# Patient Record
Sex: Female | Born: 1980 | Race: White | Hispanic: No | Marital: Married | Smoking: Never smoker
Health system: Southern US, Community
[De-identification: ages and names within clinical notes are randomized; demographics above are authoritative.]

## PROBLEM LIST (undated history)

## (undated) ENCOUNTER — Inpatient Hospital Stay (HOSPITAL_COMMUNITY): Payer: Self-pay

## (undated) DIAGNOSIS — R87629 Unspecified abnormal cytological findings in specimens from vagina: Secondary | ICD-10-CM

## (undated) DIAGNOSIS — E119 Type 2 diabetes mellitus without complications: Secondary | ICD-10-CM

## (undated) DIAGNOSIS — Z1589 Genetic susceptibility to other disease: Secondary | ICD-10-CM

## (undated) DIAGNOSIS — R51 Headache: Secondary | ICD-10-CM

## (undated) DIAGNOSIS — Z9889 Other specified postprocedural states: Secondary | ICD-10-CM

## (undated) DIAGNOSIS — J45909 Unspecified asthma, uncomplicated: Secondary | ICD-10-CM

## (undated) DIAGNOSIS — R519 Headache, unspecified: Secondary | ICD-10-CM

## (undated) DIAGNOSIS — R112 Nausea with vomiting, unspecified: Secondary | ICD-10-CM

## (undated) DIAGNOSIS — E7212 Methylenetetrahydrofolate reductase deficiency: Secondary | ICD-10-CM

## (undated) HISTORY — PX: CRYOTHERAPY: SHX1416

## (undated) HISTORY — DX: Genetic susceptibility to other disease: Z15.89

## (undated) HISTORY — PX: OTHER SURGICAL HISTORY: SHX169

## (undated) HISTORY — DX: Unspecified abnormal cytological findings in specimens from vagina: R87.629

## (undated) HISTORY — DX: Methylenetetrahydrofolate reductase deficiency: E72.12

## (undated) HISTORY — DX: Type 2 diabetes mellitus without complications: E11.9

---
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
## 2007-09-08 HISTORY — PX: DILATION AND CURETTAGE OF UTERUS: SHX78

## 2008-05-07 ENCOUNTER — Emergency Department (HOSPITAL_BASED_OUTPATIENT_CLINIC_OR_DEPARTMENT_OTHER): Admission: EM | Admit: 2008-05-07 | Discharge: 2008-05-07 | Payer: Self-pay | Admitting: Emergency Medicine

## 2008-07-29 ENCOUNTER — Emergency Department (HOSPITAL_BASED_OUTPATIENT_CLINIC_OR_DEPARTMENT_OTHER): Admission: EM | Admit: 2008-07-29 | Discharge: 2008-07-29 | Payer: Self-pay | Admitting: Emergency Medicine

## 2011-07-10 LAB — POCT TOXICOLOGY PANEL

## 2011-11-09 HISTORY — PX: DILATION AND CURETTAGE OF UTERUS: SHX78

## 2015-05-23 LAB — OB RESULTS CONSOLE HIV ANTIBODY (ROUTINE TESTING): HIV: NONREACTIVE

## 2015-05-23 LAB — OB RESULTS CONSOLE PLATELET COUNT: Platelets: 291 10*3/uL

## 2015-05-23 LAB — OB RESULTS CONSOLE GC/CHLAMYDIA
Chlamydia: NEGATIVE
GC PROBE AMP, GENITAL: NEGATIVE

## 2015-05-23 LAB — OB RESULTS CONSOLE HGB/HCT, BLOOD
HCT: 40 %
Hemoglobin: 13.6 g/dL

## 2015-05-23 LAB — OB RESULTS CONSOLE ABO/RH: RH TYPE: POSITIVE

## 2015-05-23 LAB — OB RESULTS CONSOLE RUBELLA ANTIBODY, IGM: Rubella: IMMUNE

## 2015-05-23 LAB — OB RESULTS CONSOLE HEPATITIS B SURFACE ANTIGEN: HEP B S AG: NEGATIVE

## 2015-05-23 LAB — OB RESULTS CONSOLE ANTIBODY SCREEN: Antibody Screen: NEGATIVE

## 2015-05-23 LAB — OB RESULTS CONSOLE RPR: RPR: NONREACTIVE

## 2015-05-30 ENCOUNTER — Encounter: Payer: Self-pay | Admitting: Obstetrics & Gynecology

## 2015-05-30 ENCOUNTER — Ambulatory Visit (INDEPENDENT_AMBULATORY_CARE_PROVIDER_SITE_OTHER): Payer: 59 | Admitting: Obstetrics & Gynecology

## 2015-05-30 VITALS — BP 137/87 | HR 88 | Ht 65.5 in | Wt 247.0 lb

## 2015-05-30 DIAGNOSIS — E7212 Methylenetetrahydrofolate reductase deficiency: Secondary | ICD-10-CM | POA: Insufficient documentation

## 2015-05-30 DIAGNOSIS — O2620 Pregnancy care for patient with recurrent pregnancy loss, unspecified trimester: Secondary | ICD-10-CM | POA: Insufficient documentation

## 2015-05-30 DIAGNOSIS — O09529 Supervision of elderly multigravida, unspecified trimester: Secondary | ICD-10-CM

## 2015-05-30 DIAGNOSIS — O99280 Endocrine, nutritional and metabolic diseases complicating pregnancy, unspecified trimester: Secondary | ICD-10-CM

## 2015-05-30 DIAGNOSIS — O09899 Supervision of other high risk pregnancies, unspecified trimester: Secondary | ICD-10-CM | POA: Insufficient documentation

## 2015-05-30 DIAGNOSIS — E119 Type 2 diabetes mellitus without complications: Secondary | ICD-10-CM

## 2015-05-30 DIAGNOSIS — O24319 Unspecified pre-existing diabetes mellitus in pregnancy, unspecified trimester: Secondary | ICD-10-CM | POA: Insufficient documentation

## 2015-05-30 NOTE — Progress Notes (Signed)
  Subjective:    Margaret Cabrera is being seen today for her first obstetrical visit.  This is not a planned pregnancy. She is excited about this 'miracle' pregnancy. She is at [redacted]w[redacted]d gestation. Her obstetrical history is significant for recurrent pregnancy loss; class B GDM; AMA. Relationship with FOB: spouse, living together. Patient does intend to breast feed. Pregnancy history fully reviewed. Izell [place], MD- endrinologist.  Appt with nutritionist next week 8/28.  Appt Dr. Allena Katz 07/04/2015.    Past Medical History  Diagnosis Date  . Diabetes mellitus without complication   . Vaginal Pap smear, abnormal   . Preterm labor   . MTHFR mutation      Patient reports headache and nausea.  Review of Systems:   Review of Systems  Objective:     BP 137/87 mmHg  Pulse 88  Ht 5' 5.5" (1.664 m)  Wt 247 lb (112.038 kg)  BMI 40.46 kg/m2  LMP 02/06/2015 Physical Exam  Exam Exam deferred   Assessment:    Pregnancy: [id number] Patient Active Problem List   Diagnosis Date Noted  . Antepartum multigravida of advanced maternal age 46/22/2016  . High risk multigravida, antepartum 05/30/2015  . Recurrent pregnancy loss, antepartum condition or complication 05/30/2015  . MTHFR deficiency complicating pregnancy 05/30/2015  . Maternal pregestational diabetes classes B through R, antepartum 05/30/2015       Plan:     Initial labs drawn. Prenatal vitamins. Problem list reviewed and updated. AFP3 discussed: requested. Role of ultrasound in pregnancy discussed; fetal survey: requested. Amniocentesis discussed: not indicated. Follow up in 4 weeks. 90% of 45 min visit spent on counseling and coordination of care.  Bring [facility] log to next visit and every visit Fax 2 weeks of glc Obtain prior OB records from [place] including labs and early [place], Margaret Cabrera 05/30/2015

## 2015-05-30 NOTE — Patient Instructions (Signed)
Genetic Counseling Genetic counseling helps people look at the risks of genetic problems in pregnancy. Genetic problems are problems that may be passed from the mother and father to their child.  Genetic counseling is done:  To see if a couple is at risk of passing on a problem.  To explain the cause of a disorder if present.  To understand the odds passing it on to the baby.  To determine what can be done medically.  To use in family planning to prevent or avoid a problem. Genetic counseling can be done by:  A geneticist.  A physician with special training in genetics.  Genetic counselors. The following are reasons to seek a referral for genetic counseling and/or genetic evaluation with a genetic physician: FAMILY HISTORY FACTORS   Mental retardation.  Neural tube defects (such as spina bifida).  Chromosome abnormalities (such as Down syndrome, Fragile X syndrome).  Cleft lip/cleft palate.  Heart defects.  Short stature.  Single gene defects (such as cystic fibrosis or PKU).  Hearing or visual impairments.  Learning disabilities.  Psychiatric disorders.  Cancers (breast, uterus, ovary and intestine).  Multiple pregnancy losses (miscarriages, stillbirths, or infant deaths).  Other disorders which could be considered genetic.  Either parent with a dominant disorder or any disorder seen in several generations including:  High blood pressure.  High cholesterol.  Muscular dystrophy.  Huntington's chorea.  Polycystic kidney. IF BOTH PARENTS ARE CARRIERS FOR:   Depression.  Seizures (convulsions).  Alcoholism.  Diabetes.  Thyroid disorder.  Others in which birth defects may be associated either with the disease process or with common medications prescribed for the disease.  Fetal or parental exposure to potentially harmful agents. These include:  Drugs.  Chemicals.  Infection.  Radiation.  Infertility cases where either parent is suspected  of having a chromosome abnormality.  Couples requiring assisted reproductive techniques to achieve a pregnancy, or individuals donating eggs or sperm for those purposes. OTHER FACTORS   Persons in specific ethnic groups or geographic areas with a higher incidence of certain disorders. These include Tay Sachs disease, sickle cell disease, or thalassemia.  Extreme parental concern or fear of having a child with a birth defect.  Parents are blood relatives (consanguinity) or incest in a pregnancy is involved.  Premarital or preconception counseling in couples at high risk for genetic disorders based on family or personal medical history.  The birth of an affected child or by carrier screening.  Mother, known, or presumed carrier of an X-linked disorder such as hemophilia.  Either parent is a known carrier of a balanced chromosome abnormality.  A previous baby with a genetic disorder.  After the baby is born, you find out the baby has a genetic disorder. PREGNANCY FACTORS   The mother is over 35 years old a the time of delivery.  Maternal serum screening indicating an increased risk for neural tube defects, Down Syndrome, or trisomy 18.  Abnormal prenatal diagnostic test results or abnormal prenatal ultrasound examination.  Maternal factors such as:  Schizophrenia.  Seizures.  Depression.  Alcoholism.  Diabetes.  Thyroid disorder.  Other factors in which birth defects may be associated either with the disease process or with common medications prescribed for the disease.  Fetal or parental exposure to potentially harmful agents such as:  Drugs.  Chemicals.  Radiation.  Infection.  The father is over the age of 55 years at the time of conception.  Infertility cases where either parent is suspected of having a chromosome abnormality.    Couples requiring assisted reproductive techniques to achieve a pregnancy, or individuals donating eggs or sperm for those  purposes.  Recurrent pregnancy loss.  Stillborn baby. FINDING A GENETIC COUNSELOR Most caregivers know and have a list of genetic counselors. Your caregiver can refer you to one of them. You can also contact the National Society of Genetic Counselors for their locations and for more information.  AFTER RECEIVING GENETIC COUNSELING Genetic counselors can help you understand what options you have. They can help you understand what to do next. They can share the experiences of other couples who had babies with genetic problems. If you are at high risk for having a baby with a severe or fatal genetic disorder, you have several options:  Have in vitro fertilization with healthy eggs that were fertilized in vitro.  Use donor sperm or eggs.  Adopt a baby. Genetic counselors can also help if you find out when you are pregnant that the baby has a severe or fatal genetic disorder. Some options are:  Help you prepare for a particular disorder by speaking to:  Specialists in caring for Down syndrome.  Surgeon for heart defects.  Social workers.  Support groups.  Fetal surgery is sometimes possible and helpful to treat some genetic defects.  Ending the pregnancy. Document Released: 12/15/2002 Document Revised: 12/17/2011 Document Reviewed: 11/05/2007 Exit[employer] Patient Information 2015 [employer]. This information is not intended to replace advice given to you by your health care provider. Make sure you discuss any questions you have with your health care provider.  

## 2015-06-17 ENCOUNTER — Ambulatory Visit (HOSPITAL_COMMUNITY)
Admission: RE | Admit: 2015-06-17 | Discharge: 2015-06-17 | Disposition: A | Discharge status: Home / Self Care | Payer: 59 | Source: Ambulatory Visit | Attending: Obstetrics & Gynecology | Admitting: Obstetrics & Gynecology

## 2015-06-17 ENCOUNTER — Other Ambulatory Visit: Payer: Self-pay | Admitting: Obstetrics & Gynecology

## 2015-06-17 ENCOUNTER — Encounter (HOSPITAL_COMMUNITY): Payer: Self-pay

## 2015-06-17 DIAGNOSIS — Z315 Encounter for genetic counseling: Secondary | ICD-10-CM | POA: Insufficient documentation

## 2015-06-17 DIAGNOSIS — O09529 Supervision of elderly multigravida, unspecified trimester: Secondary | ICD-10-CM

## 2015-06-17 DIAGNOSIS — O2620 Pregnancy care for patient with recurrent pregnancy loss, unspecified trimester: Secondary | ICD-10-CM

## 2015-06-17 DIAGNOSIS — O99280 Endocrine, nutritional and metabolic diseases complicating pregnancy, unspecified trimester: Secondary | ICD-10-CM

## 2015-06-17 DIAGNOSIS — O99211 Obesity complicating pregnancy, first trimester: Secondary | ICD-10-CM

## 2015-06-17 DIAGNOSIS — O24911 Unspecified diabetes mellitus in pregnancy, first trimester: Secondary | ICD-10-CM

## 2015-06-17 DIAGNOSIS — Z3A13 13 weeks gestation of pregnancy: Secondary | ICD-10-CM

## 2015-06-17 DIAGNOSIS — E7212 Methylenetetrahydrofolate reductase deficiency: Secondary | ICD-10-CM

## 2015-06-17 DIAGNOSIS — O34219 Maternal care for unspecified type scar from previous cesarean delivery: Secondary | ICD-10-CM

## 2015-06-17 DIAGNOSIS — O09899 Supervision of other high risk pregnancies, unspecified trimester: Secondary | ICD-10-CM

## 2015-06-17 DIAGNOSIS — O09521 Supervision of elderly multigravida, first trimester: Secondary | ICD-10-CM | POA: Diagnosis not present

## 2015-06-17 NOTE — Progress Notes (Signed)
Genetic Counseling  High-Risk Gestation Note  Appointment Date:  06/17/2015 Referred By: Willodean Rosenthal* Date of Birth:  05/16/1981   Pregnancy History: [id number] Estimated Date of Delivery: 12/21/15 Estimated Gestational Age: [redacted]w[redacted]d Attending: Eulis Foster, MD   [name] was seen for genetic counseling because of a maternal age of 34 y.o..  She will be 34 years old at delivery. She was accompanied by her paternal grandmother to today's visit.   In Summary:   Reviewed advanced maternal age and associated risks for fetal aneuploidy  Patient elected to have NIPS (Panorama) today; Declined CVS and amniocentesis at this time  NT ultrasound performed today and within normal limits  Patient also reported history of recurrent pregnancy loss, MTHFR, and diabetes. Please contact our office to schedule MFM consult, if one is desired.  Detailed ultrasound scheduled 07/29/15  She was counseled regarding maternal age and the association with risk for chromosome conditions due to nondisjunction with aging of the ova.   We reviewed chromosomes, nondisjunction, and the associated 1 in 114 risk for fetal aneuploidy at [redacted]w[redacted]d gestation related to a maternal age of 34 years old at delivery.  She was counseled that the risk for aneuploidy decreases as gestational age increases, accounting for those pregnancies which spontaneously abort.  We specifically discussed Down syndrome (trisomy 78), trisomies 29 and 13, and sex chromosome aneuploidies (47,XXX and 47,XXY) including the common features and prognoses of each.   We reviewed available screening options including First Screen, Quad screen, noninvasive prenatal screening (NIPS)/cell free DNA (cfDNA) testing, and detailed ultrasound.  She was counseled that screening tests are used to modify a patient's a priori risk for aneuploidy, typically based on age. This estimate provides a pregnancy specific risk assessment. We reviewed the benefits  and limitations of each option. Specifically, we discussed the conditions for which each test screens, the detection rates, and false positive rates of each. She was also counseled regarding diagnostic testing via CVS and amniocentesis. We reviewed the approximate 1 in 100 risk for complications for CVS and the approximate 1 in 300-500 risk for complications for amniocentesis, including spontaneous pregnancy loss.   After consideration of all the options, she elected to proceed with NIPS (Panorama through [facility] laboratory).  Those results will be available in 8-10 days.  She declined CVS and amniocentesis today and stated that she would possibly consider amniocentesis if there were findings on NIPS and/or ultrasound.   She also expressed interest in pursuing a nuchal translucency ultrasound, which was performed today.  The report will be documented separately.  The patient would like to return for a detailed ultrasound at ~18+ weeks gestation.  This appointment was scheduled for 07/29/15. She understands that screening tests cannot rule out all birth defects or genetic syndromes. The patient was advised of this limitation and states she still does not want additional testing at this time.   [name] was provided with written information regarding cystic fibrosis (CF) including the carrier frequency and incidence in the Caucasian population, the availability of carrier testing and prenatal diagnosis if indicated.  In addition, we discussed that CF is routinely screened for as part of the [place] newborn screening panel.  She declined CF carrier screening today.   Both family histories were reviewed and found to be contributory for history of 8 first trimester spontaneous abortions for the couple. The patient reported that a lab work was done during her pregnancy with her daughter given this history, and she was discovered to have MTHFR.  We do not currently have access to these records, but the patient reported  that she could access them to confirm the lab work that was performed. She stated that she does not think that peripheral chromosome testing was done at that time. Approximately 1 in 6 confirmed pregnancies results in miscarriage. A single underlying cause is more likely to be suspected when a couple has experienced 3 or more losses. It is less likely that there will be an identifiable single underlying cause when a couple has experienced less than 3 losses. Several possible causes include chromosome rearrangements, antibodies, and thrombophilia. In approximately 3-8% of couples with recurrent pregnancy loss, one partner carries a chromosome variant, such as a balanced translocation. Being a carrier of a chromosome variant can increase the risk for abnormalities in the sperm or egg cell, which can increase the risk for miscarriage or the birth of a child with birth defects and/or intellectual disability. Additional work-up may be available to the patient, if desired, depending upon the evaluations previously performed.   The MTHFR gene makes an enzyme whose activity is involved in helping convert amino acids (the building blocks of proteins) homocysteine to methionine in the body. The MTHFR enzyme is involved in making a form of folate, which is important in regulating the level of homocysteine in the body. Certain gene changes in MTHFR have been identified that cause the gene not to work properly, causing less of the enzyme to be made. This can result in an increased amount of homocysteine in the blood and urine (hyperhomocysteinemia and homocysteinuria) and also possibly low serum folate levels, which has been suggested to be associated with coronary heart disease. This association has been more strongly associated with individuals who are homozygous (both gene copies affected) for the C677T change in MTHFR. Recent available literature does not support an association with MTHFR variants and a significant  increased risk for blood clots or spontaneous miscarriage in pregnancy. The gene change is quite common in the general population, with an estimated frequency of 5-14%. The effects of heterozygosity for C677T are variable, and the majority of individuals are not symptomatic. [name] did not have records today regarding whether she is homozygous or heterozygous. Additional records may further clarify risk assessment for the patient.   Additionally, Margaret Cabrera reported that her maternal uncle has mild intellectual disability with no identified etiology. We discussed that there are many different causes of intellectual disabilities including environmental, multifactorial, and genetic etiologies.  We discussed that a specific diagnosis for intellectual disability can be determined in approximately 50% of these individuals.  In the remaining 50% of individuals, a diagnosis may never be determined.  Regarding genetic causes, we discussed that chromosome aberrations (aneuploidy, deletions, duplications, insertions, and translocations) are responsible for a small percentage of individuals with intellectual disability.  Many individuals with chromosome aberrations have additional differences, including congenital anomalies or minor dysmorphisms.  Likewise, single gene conditions are the underlying cause of intellectual delay in some families.  We discussed that many gene conditions have intellectual disability as a feature, but also often include other physical or medical differences.  Specifically, we reviewed fragile X syndrome and the X-linked inheritance of this condition.  We discussed the option of FMR1 (the gene that when altered causes fragile X syndrome) analysis to determine whether Fragile X syndrome is the cause of intellectual disability in this family.  Ms. Margaret Cabrera declined FMR1 analysis today. We discussed that without more specific information, it is difficult to provide an  accurate risk assessment.     [name] also reported that her brother had a history of stroke. Following a car crash at age 13 years old, he was discovered to have a congenital hole in the heart, which then was corrected with surgery. He died at age 31 years old. Congenital heart defects occur in approximately 1% of pregnancies.  Congenital heart defects may occur due to multifactorial influences, chromosomal abnormalities, genetic syndromes or environmental exposures.  Isolated heart defects are generally multifactorial.  Given the reported family history and assuming multifactorial inheritance, the risk for a congenital heart defect in the current pregnancy would be approximately 1-2%. Without further information regarding the provided family history, an accurate genetic risk cannot be calculated. Further genetic counseling is warranted if more information is obtained.  [name] denied exposure to environmental toxins or chemical agents. She denied the use of alcohol, tobacco or street drugs. She denied significant viral illnesses during the course of her pregnancy. Her medical and surgical histories were contributory diabetes, which is diet controlled, and MTHFR carrier. Please contact our office if MFM consultation is desired regarding the patient's medical history.    I counseled [name] regarding the above risks and available options.  The approximate face-to-face time with the genetic counselor was 40 minutes.  Quinn Plowman, MS,  Certified Genetic Counselor 06/17/2015

## 2015-06-27 ENCOUNTER — Encounter: Payer: Self-pay | Admitting: Advanced Practice Midwife

## 2015-06-27 ENCOUNTER — Encounter: Payer: Self-pay | Admitting: Obstetrics & Gynecology

## 2015-06-27 ENCOUNTER — Telehealth (HOSPITAL_COMMUNITY): Payer: Self-pay | Admitting: *Deleted

## 2015-06-27 ENCOUNTER — Ambulatory Visit (INDEPENDENT_AMBULATORY_CARE_PROVIDER_SITE_OTHER): Payer: 59 | Admitting: Obstetrics & Gynecology

## 2015-06-27 ENCOUNTER — Other Ambulatory Visit (HOSPITAL_COMMUNITY): Payer: Self-pay | Admitting: Obstetrics & Gynecology

## 2015-06-27 VITALS — BP 121/69 | HR 79 | Wt 247.0 lb

## 2015-06-27 DIAGNOSIS — O09899 Supervision of other high risk pregnancies, unspecified trimester: Secondary | ICD-10-CM

## 2015-06-27 DIAGNOSIS — O24912 Unspecified diabetes mellitus in pregnancy, second trimester: Secondary | ICD-10-CM

## 2015-06-27 DIAGNOSIS — O3421 Maternal care for scar from previous cesarean delivery: Secondary | ICD-10-CM

## 2015-06-27 DIAGNOSIS — O34219 Maternal care for unspecified type scar from previous cesarean delivery: Secondary | ICD-10-CM

## 2015-06-27 DIAGNOSIS — O24312 Unspecified pre-existing diabetes mellitus in pregnancy, second trimester: Secondary | ICD-10-CM

## 2015-06-27 MED ORDER — FLUTICASONE PROPIONATE 50 MCG/ACT NA SUSP: 2.0000 | Freq: Every day | NASAL | Status: DC

## 2015-06-27 MED ORDER — METFORMIN HCL 500 MG PO TABS: 500.0000 mg | ORAL_TABLET | Freq: Every day | ORAL | Status: DC

## 2015-06-27 NOTE — Progress Notes (Signed)
C/O nasal allergies and congestion. Requests flonase Rx.

## 2015-06-27 NOTE — Patient Instructions (Signed)
Type 1 or Type 2 Diabetes Mellitus During Pregnancy Diabetes mellitus, often simply referred to as diabetes, is a long-term (chronic) disease. Type 1 diabetes occurs when the islet cells, which are in the pancreas and make the hormone insulin, are destroyed and can no longer make insulin. Type 2 diabetes occurs when the pancreas does not make enough insulin, the cells are less responsive to the insulin that is made (insulin resistance), or both. Insulin is needed to move sugars from food into the tissue cells. The tissue cells use the sugars for energy. The lack of insulin or the lack of normal response to insulin causes excess sugars to build up in the blood instead of going into the tissue cells. As a result, high blood sugar (hyperglycemia) develops.  If blood glucose levels are kept in the normal range both before and during pregnancy, women can have a healthy pregnancy. If your blood glucose levels are not well controlled, there may be risks to you, your unborn baby, and your labor and delivery. Also, there may be risks to your baby once he or she is born.  RISK FACTORS  You are predisposed to developing type 1 diabetes if someone in your family has diabetes and you are exposed to certain environmental triggers.  You have an increased chance of developing type 2 diabetes if you have a family history of diabetes and also have one or more of the following risk factors:  Being overweight.  Having an inactive lifestyle.  Having a history of consistently eating high-calorie foods. SYMPTOMS  Increased thirst (polydipsia).  Increased urination (polyuria).  Increased urination during the night (nocturia).  Weight loss. This weight loss may be rapid.  Frequent, recurring infections.  Tiredness (fatigue).  Weakness.  Vision changes, such as blurred vision.  Fruity smell to your breath.  Abdominal pain.  Nausea or vomiting. DIAGNOSIS  Diabetes is diagnosed when blood glucose levels  are increased. Your blood glucose level may be checked by one or more of the following blood tests:  A fasting blood glucose test. You will not be allowed to eat for at least 8 hours before a blood sample is taken.  A random blood glucose test. Your blood glucose is checked at any time of the day regardless of when you ate.  A hemoglobin A1c blood glucose test. A hemoglobin A1c test provides information about blood glucose control over the previous 3 months.  An oral glucose tolerance test (OGTT). Your blood glucose is measured after you have not eaten (fasted) for 1-3 hours and then after you drink a glucose-containing beverage. An OGTT is usually performed during weeks 24-28 of your pregnancy. TREATMENT   You will need to take diabetes medicine or insulin daily to keep blood glucose levels in the desired range.  You will need to match insulin dosing with exercise and healthy food choices. The treatment goal is to maintain the before-meal (preprandial), bedtime, and overnight blood glucose level at 60-99 mg/dL during pregnancy. The treatment goal is to further maintain the peak after-meal blood sugar (postprandial glucose) level at 100-140 mg/dL.  HOME CARE INSTRUCTIONS   Have your hemoglobin A1c level checked twice a year.  Perform daily blood glucose monitoring as directed by your health care provider. It is common to perform frequent blood glucose monitoring.  Monitor urine ketones when you are sick and as directed by your health care provider.  Take your diabetes medicine and insulin as directed by your health care provider to maintain your blood glucose   level in the desired range.  Never run out of diabetes medicine or insulin. It is needed every day.  Adjust insulin based on your intake of carbohydrates. Carbohydrates can raise blood glucose levels but need to be included in your diet. Carbohydrates provide vitamins, minerals, and fiber, which are an essential part of a healthy  diet. Carbohydrates are found in fruits, vegetables, whole grains, dairy products, legumes, and foods containing added sugars.  Eat healthy foods. Alternate 3 meals with 3 snacks.  Maintain a healthy weight gain. The usual total expected weight gain varies according to your prepregnancy body mass index (BMI).  Carry a medical alert card or wear medical alert jewelry.  Carry a 15-gram carbohydrate snack with you at all times to treat low blood sugar (hypoglycemia). Some examples of 15-gram carbohydrate snacks include:  Glucose tablets, 3 or 4.  Glucose gel, 15-gram tube.  Raisins, 2 Tbsp (24 grams).  Jelly beans, 6.  Animal crackers, 8.  Fruit juice, regular soda, or low-fat milk, 4 ounces (120 mL).  Gummy treats, 9.  Recognize hypoglycemia. Hypoglycemia during pregnancy occurs with blood glucose levels of 60 mg/dL and below. The risk for hypoglycemia increases when fasting or skipping meals, during or after intense exercise, and during sleep. Hypoglycemia symptoms can include:  Tremors or shakes.  Decreased ability to concentrate.  Sweating.  Increased heart rate.  Headache.  Dry mouth.  Hunger.  Irritability.  Anxiety.  Restless sleep.  Altered speech or coordination.  Confusion.  Treat hypoglycemia promptly. If you are alert and able to safely swallow, follow the 15:15 rule:  Take 15-20 grams of rapid-acting glucose or carbohydrate. Rapid-acting options include glucose gel, glucose tablets, or 4 ounces (120 mL) of fruit juice, regular soda, or low-fat milk.  Check your blood glucose level 15 minutes after taking the glucose.  Take an additional 15-20 grams of glucose if the repeat blood glucose level is still 70 mg/dL or below.  Eat a meal or snack within 1 hour once blood glucose levels return to normal.  Engage in at least 30 minutes of physical activity a day or as directed by your health care provider. Ten minutes of physical activity timed 30  minutes after each meal is encouraged to control postprandial blood glucose levels.  Watch for polyuria (excess urination) and polydipsia (feeling extra thirsty), which are early signs of hyperglycemia. An early awareness of hyperglycemia allows for prompt treatment. Treat hyperglycemia as directed by your health care provider.  Adjust your insulin dosing and food intake, as needed, if you start a new exercise or sport.  Follow your sick-day plan any time you are unable to eat or drink as usual.  Avoid tobacco and alcohol use.  Keep all follow-up visits as directed by your health care provider.  Follow the advice of your health care provider regarding your prenatal and post-delivery (postpartum) appointments, meal planning, exercise, medicines, vitamins, blood tests, other medical tests, and physical activities.  Continue daily skin and foot care. Examine your skin and feet daily for cuts, bruises, redness, nail problems, bleeding, blisters, or sores. A foot exam by a health care provider should be done annually.  Brush your teeth and gums at least twice a day and floss at least once a day. Follow up with your dentist regularly.  Schedule an eye exam during the first trimester of your pregnancy or as directed by your health care provider.  Share your diabetes management plan with your workplace or school.  Stay up-to-date with immunizations.    Learn to manage stress.  Obtain ongoing diabetes education and support as needed.  Your health care provider may recommend that you take one low-dose aspirin (81 mg) each day to help prevent high blood pressure during your pregnancy (preeclampsia or eclampsia). You may be at risk for preeclampsia or eclampsia if:  You had preeclampsia or eclampsia during a previous pregnancy.  Your baby did not grow as expected during a previous pregnancy.  You experienced preterm birth with a previous pregnancy.  You experienced a separation of the placenta  from the uterus (placental abruption) during a previous pregnancy.  You experienced the loss of your baby during a previous pregnancy.  You are pregnant with more than one baby.  You have other medical conditions, such as high blood pressure or autoimmune disease. SEEK MEDICAL CARE IF:   You are unable to eat food or drink fluids for more than 6 hours.  You have nausea and vomiting for more than 6 hours.  You have a blood glucose level of 200 mg/dL and you have ketones in your urine.  There is a change in mental status.  You develop vision problems.  You have a persistent headache.  You have upper abdominal pain or discomfort.  You have an additional serious sickness.  You have diarrhea for more than 6 hours.  You have been sick or have had a fever for 2 days and are not getting better. SEEK IMMEDIATE MEDICAL CARE IF:  You have difficulty breathing.  You no longer feel your baby moving.  You are bleeding or have discharge from your vagina.  You start having premature contractions or labor. MAKE SURE YOU:  Understand these instructions.  Will watch your condition.  Will get help right away if you are not doing well or get worse. Document Released: 06/18/2012 Document Revised: 02/08/2014 Document Reviewed: 06/18/2012 Exit[employer] Patient Information 2015 [employer]. This information is not intended to replace advice given to you by your health care provider. Make sure you discuss any questions you have with your health care provider.  

## 2015-06-27 NOTE — Telephone Encounter (Signed)
LM for pt to return call regarding lab results.

## 2015-06-27 NOTE — Telephone Encounter (Signed)
Pt returned TC.  Low risk Panorama result given to pt.  Pt voices understanding.

## 2015-06-27 NOTE — Progress Notes (Signed)
Subjective:  Margaret Cabrera is a 34 y.o. [id number] at [redacted]w[redacted]d being seen today for ongoing prenatal care.  Patient reports no bleeding and no contractions.   .  Vag. Bleeding: None.  . Denies leaking of fluid.   The following portions of the patient's history were reviewed and updated as appropriate: allergies, current medications, past family history, past medical history, past social history, past surgical history and problem list.   Objective:   Filed Vitals:   06/27/15 0922  BP: 121/69  Pulse: 79  Weight: 247 lb (112.038 kg)   Blood Sugar Log (most recent first):   Fastings:    80,99,94,109,104,102,106,95,104,100,106,85,99,96 2 Hr PC Breakfast:  56,21,30,865,78,46,962,95,284 (ate badly due to work),77,89,96,82,85 2 Hr PC Lunch:        100,87,108,80,91,89,165 (same day, ate badly),99,73,88 2 hr PC Dinner:        [phone]  Was out of test strips for a while due to insurance issues  Fetal Status:           General:  Alert, oriented and cooperative. Patient is in no acute distress.  Skin: Skin is warm and dry. No rash noted.   Cardiovascular: Normal heart rate noted  Respiratory: Normal respiratory effort, no problems with respiration noted  Abdomen: Soft, gravid, appropriate for gestational age. Pain/Pressure: Absent     Pelvic: Vag. Bleeding: None Vag D/C Character: Thin   Cervical exam deferred        Extremities: Normal range of motion.  Edema: None  Mental Status: Normal mood and affect. Normal behavior. Normal judgment and thought content.   Urinalysis: Urine Protein: Negative Urine Glucose: Trace  Assessment and Plan:  Pregnancy: [id number] at [redacted]w[redacted]d  1. History of cesarean delivery, antepartum     Did not get into VBAC/RCS issue at this visit. Spent most of our time discussing blood sugars  2. High risk multigravida, antepartum     Seeing MFM for Panorama testing and has anatomy US scheduled   3:  Type II DM:      Discussed importance of maintaining blood sugars  within range and risk to baby if uncontrolled      Pt stated "maybe that's why my first baby was so big"      Consulted Dr Jolayne Panther who recommends starting Glyburide HS or Metformin bid.       Patient is VERY reluctant to start medication.  Discussed risks of not doing it include fetal anomalies, growth issues and possible fetal death.   Agrees to start once nightly dose of Metformin (because she had heard of that med from her other doctor).  Will start with  qhs then advance dosing as needed. Discussed likely will need BID Metformin as pregnancy progresses  Preterm labor symptoms and general obstetric precautions including but not limited to vaginal bleeding, contractions, leaking of fluid and fetal movement were reviewed in detail with the patient. Please refer to After Visit Summary for other counseling recommendations.   Return in 4 weeks to [place] office  Aviva Signs, CNM

## 2015-06-30 ENCOUNTER — Encounter: Payer: Self-pay | Admitting: Nurse Practitioner

## 2015-07-07 ENCOUNTER — Encounter: Payer: Self-pay | Admitting: Obstetrics & Gynecology

## 2015-07-18 ENCOUNTER — Encounter (HOSPITAL_BASED_OUTPATIENT_CLINIC_OR_DEPARTMENT_OTHER): Payer: Self-pay | Admitting: *Deleted

## 2015-07-18 ENCOUNTER — Emergency Department (HOSPITAL_BASED_OUTPATIENT_CLINIC_OR_DEPARTMENT_OTHER)
Admission: EM | Admit: 2015-07-18 | Discharge: 2015-07-19 | Disposition: A | Discharge status: Home / Self Care | Payer: 59 | Attending: Emergency Medicine | Admitting: Emergency Medicine

## 2015-07-18 DIAGNOSIS — O9989 Other specified diseases and conditions complicating pregnancy, childbirth and the puerperium: Secondary | ICD-10-CM | POA: Diagnosis not present

## 2015-07-18 DIAGNOSIS — Z3A18 18 weeks gestation of pregnancy: Secondary | ICD-10-CM | POA: Insufficient documentation

## 2015-07-18 DIAGNOSIS — R1031 Right lower quadrant pain: Secondary | ICD-10-CM | POA: Diagnosis present

## 2015-07-18 LAB — CBC
HCT: 37.3 % (ref 36.0–46.0)
Hemoglobin: 12.4 g/dL (ref 12.0–15.0)
MCH: 28.5 pg (ref 26.0–34.0)
MCHC: 33.2 g/dL (ref 30.0–36.0)
MCV: 85.7 fL (ref 78.0–100.0)
PLATELETS: 244 10*3/uL (ref 150–400)
RBC: 4.35 MIL/uL (ref 3.87–5.11)
RDW: 14.5 % (ref 11.5–15.5)
WBC: 8.4 10*3/uL (ref 4.0–10.5)

## 2015-07-18 LAB — URINALYSIS, ROUTINE W REFLEX MICROSCOPIC
Bilirubin Urine: NEGATIVE
Glucose, UA: NEGATIVE mg/dL
Hgb urine dipstick: NEGATIVE
Ketones, ur: NEGATIVE mg/dL
Leukocytes, UA: NEGATIVE
NITRITE: NEGATIVE
Protein, ur: NEGATIVE mg/dL
SPECIFIC GRAVITY, URINE: 1.009 (ref 1.005–1.030)
UROBILINOGEN UA: 0.2 mg/dL (ref 0.0–1.0)
pH: 7.5 (ref 5.0–8.0)

## 2015-07-18 LAB — COMPREHENSIVE METABOLIC PANEL
ALBUMIN: 3.2 g/dL — AB (ref 3.5–5.0)
ALT: 9 U/L — AB (ref 14–54)
AST: 13 U/L — AB (ref 15–41)
Alkaline Phosphatase: 40 U/L (ref 38–126)
Anion gap: 6 (ref 5–15)
BUN: 8 mg/dL (ref 6–20)
CHLORIDE: 109 mmol/L (ref 101–111)
CO2: 21 mmol/L — AB (ref 22–32)
CREATININE: 0.73 mg/dL (ref 0.44–1.00)
Calcium: 8.6 mg/dL — ABNORMAL LOW (ref 8.9–10.3)
GFR calc Af Amer: >60 mL/min (ref >60)
GFR calc non Af Amer: >60 mL/min (ref >60)
Glucose, Bld: 102 mg/dL — ABNORMAL HIGH (ref 65–99)
Potassium: 3.4 mmol/L — ABNORMAL LOW (ref 3.5–5.1)
SODIUM: 136 mmol/L (ref 135–145)
Total Bilirubin: 0.2 mg/dL — ABNORMAL LOW (ref 0.3–1.2)
Total Protein: 6.7 g/dL (ref 6.5–8.1)

## 2015-07-18 LAB — LIPASE, BLOOD: LIPASE: 24 U/L (ref 22–51)

## 2015-07-18 NOTE — ED Notes (Signed)
Fetal heart tones counted. Tones are strong and regular

## 2015-07-18 NOTE — ED Notes (Signed)
Patient to be transferred with IV in place per the MD. Report called to Charge RN at [facility]

## 2015-07-18 NOTE — ED Notes (Signed)
Abdominal cramps. States she is [redacted] weeks pregnant. Nausea. Denies vaginal bleeding.

## 2015-07-19 ENCOUNTER — Emergency Department (HOSPITAL_COMMUNITY): Payer: 59

## 2015-07-19 LAB — HCG, QUANTITATIVE, PREGNANCY: HCG, BETA CHAIN, QUANT, S: 14068 m[IU]/mL — AB (ref <5)

## 2015-07-19 LAB — WET PREP, GENITAL
Trich, Wet Prep: NONE SEEN
Yeast Wet Prep HPF POC: NONE SEEN

## 2015-07-19 LAB — CBG MONITORING, ED: GLUCOSE-CAPILLARY: 79 mg/dL (ref 65–99)

## 2015-07-19 MED ORDER — ACETAMINOPHEN 500 MG PO TABS
1000.0000 mg | ORAL_TABLET | Freq: Once | ORAL | Status: AC
  Administered 2015-07-19: 1000 mg via ORAL
  Filled 2015-07-19: qty 2

## 2015-07-19 NOTE — Discharge Instructions (Signed)
You were seen for abdominal pain during pregnancy. There is no evidence of appendicitis on your MRI. He did have a normal corpus luteum cyst which can sometimes calls pain. Other considerations include round ligament pain.   Follow-up. OB/GYN. Tylenol as needed for pain.  Abdominal Pain During Pregnancy Abdominal pain is common in pregnancy. Most of the time, it does not cause harm. There are many causes of abdominal pain. Some causes are more serious than others. Some of the causes of abdominal pain in pregnancy are easily diagnosed. Occasionally, the diagnosis takes time to understand. Other times, the cause is not determined. Abdominal pain can be a sign that something is very wrong with the pregnancy, or the pain may have nothing to do with the pregnancy at all. For this reason, always tell your health care provider if you have any abdominal discomfort. HOME CARE INSTRUCTIONS  Monitor your abdominal pain for any changes. The following actions may help to alleviate any discomfort you are experiencing:  Do not have sexual intercourse or put anything in your vagina until your symptoms go away completely.  Get plenty of rest until your pain improves.  Drink clear fluids if you feel nauseous. Avoid solid food as long as you are uncomfortable or nauseous.  Only take over-the-counter or prescription medicine as directed by your health care provider.  Keep all follow-up appointments with your health care provider. SEEK IMMEDIATE MEDICAL CARE IF:  You are bleeding, leaking fluid, or passing tissue from the vagina.  You have increasing pain or cramping.  You have persistent vomiting.  You have painful or bloody urination.  You have a fever.  You notice a decrease in your baby's movements.  You have extreme weakness or feel faint.  You have shortness of breath, with or without abdominal pain.  You develop a severe headache with abdominal pain.  You have abnormal vaginal discharge with  abdominal pain.  You have persistent diarrhea.  You have abdominal pain that continues even after rest, or gets worse. MAKE SURE YOU:   Understand these instructions.  Will watch your condition.  Will get help right away if you are not doing well or get worse.   This information is not intended to replace advice given to you by your health care provider. Make sure you discuss any questions you have with your health care provider.   Document Released: 09/24/2005 Document Revised: 07/15/2013 Document Reviewed: 04/23/2013 Elsevier Interactive Patient Education [employer]

## 2015-07-19 NOTE — ED Provider Notes (Signed)
Patient transferred for MRI of the abdomen.  Patient with right lower quadrant pain in the setting of pregnancy. Nontoxic on exam. Currently 8 out of 10 pain. Has previously refused pain medication. Patient given 1 g of oral Tylenol. MRI negative for appendicitis. Patient reassured. She does have a corpus luteum cyst on the right. Pain is in the right lower quadrant and have low suspicion for gallbladder pathology at this time. Patient could have round ligament pain. Discussed this with the patient and her husband. Close follow-up with OB.  Results for orders placed or performed during the hospital encounter of 07/18/15  Wet prep, genital  Result Value Ref Range   Yeast Wet Prep HPF POC NONE SEEN NONE SEEN   Trich, Wet Prep NONE SEEN NONE SEEN   Clue Cells Wet Prep HPF POC FEW (A) NONE SEEN   WBC, Wet Prep HPF POC FEW (A) NONE SEEN  Urinalysis, Routine w reflex microscopic (not at [facility])  Result Value Ref Range   Color, Urine YELLOW YELLOW   APPearance CLOUDY (A) CLEAR   Specific Gravity, Urine 1.009 1.005 - 1.030   pH 7.5 5.0 - 8.0   Glucose, UA NEGATIVE NEGATIVE mg/dL   Hgb urine dipstick NEGATIVE NEGATIVE   Bilirubin Urine NEGATIVE NEGATIVE   Ketones, ur NEGATIVE NEGATIVE mg/dL   Protein, ur NEGATIVE NEGATIVE mg/dL   Urobilinogen, UA 0.2 0.0 - 1.0 mg/dL   Nitrite NEGATIVE NEGATIVE   Leukocytes, UA NEGATIVE NEGATIVE  Lipase, blood  Result Value Ref Range   Lipase 24 22 - 51 U/L  Comprehensive metabolic panel  Result Value Ref Range   Sodium 136 135 - 145 mmol/L   Potassium 3.4 (L) 3.5 - 5.1 mmol/L   Chloride 109 101 - 111 mmol/L   CO2 21 (L) 22 - 32 mmol/L   Glucose, Bld 102 (H) 65 - 99 mg/dL   BUN 8 6 - 20 mg/dL   Creatinine, Ser 1.61 0.44 - 1.00 mg/dL   Calcium 8.6 (L) 8.9 - 10.3 mg/dL   Total Protein 6.7 6.5 - 8.1 g/dL   Albumin 3.2 (L) 3.5 - 5.0 g/dL   AST 13 (L) 15 - 41 U/L   ALT 9 (L) 14 - 54 U/L   Alkaline Phosphatase 40 38 - 126 U/L   Total Bilirubin 0.2 (L) 0.3 -  1.2 mg/dL   GFR calc non Af Amer >60 >60 mL/min   GFR calc Af Amer >60 >60 mL/min   Anion gap 6 5 - 15  CBC  Result Value Ref Range   WBC 8.4 4.0 - 10.5 K/uL   RBC 4.35 3.87 - 5.11 MIL/uL   Hemoglobin 12.4 12.0 - 15.0 g/dL   HCT 09.6 04.5 - 40.9 %   MCV 85.7 78.0 - 100.0 fL   MCH 28.5 26.0 - 34.0 pg   MCHC 33.2 30.0 - 36.0 g/dL   RDW 81.1 91.4 - 78.2 %   Platelets 244 150 - 400 K/uL  hCG, quantitative, pregnancy  Result Value Ref Range   hCG, Beta Chain, Quant, S 14068 (H) <5 mIU/mL  POC CBG, ED  Result Value Ref Range   Glucose-Capillary 79 65 - 99 mg/dL   Mr Pelvis Wo Contrast  07/19/2015   CLINICAL DATA:  34 year old woman, [redacted] weeks pregnant. Complaint of new in constant lower mid abdominal pain with onset around 7:30 p.m. Increased nausea, legs, and low back pain. History of diabetes.  EXAM: MRI ABDOMEN WITHOUT CONTRAST  TECHNIQUE: Multiplanar multisequence MR imaging  was performed without the administration of intravenous contrast.  COMPARISON:  Ultrasound pelvis 06/17/2015  FINDINGS: An intrauterine pregnancy is demonstrated. Fetus is visualized within anterior placenta present. Simple appearing cyst in the right ovary measuring 2.7 cm diameter, likely representing corpus luteal cyst. Left ovary is unremarkable. No abnormal adnexal masses. Bladder wall is not thickened. No free pelvic fluid collections. No loculated fluid collections. No pelvic mass or lymphadenopathy. The appendix is identified and appears normal. No periappendiceal inflammation or fluid. No evidence of acute appendicitis. Scattered diverticula in the sigmoid colon without evidence of diverticulitis.  The liver, spleen, pancreas, adrenal glands, abdominal aorta, inferior vena cava, and retroperitoneal lymph nodes are unremarkable. No bile duct dilatation. Gallbladder is not identified and may be surgically absent or physiologically contracted. Stomach, small bowel, and colon are not abnormally distended. No  hydronephrosis in either kidney. Tiny cyst in the left kidney. No free fluid in the abdomen.  IMPRESSION: Normal appendix. No evidence of appendicitis. Intrauterine pregnancy demonstrated. Corpus luteal cyst in the right ovary. No evidence of bowel obstruction or acute inflammatory process.   Electronically Signed   By: Burman Nieves M.D.   On: 07/19/2015 02:39   Mr Abdomen Wo Contrast  07/19/2015   CLINICAL DATA:  34 year old woman, [redacted] weeks pregnant. Complaint of new in constant lower mid abdominal pain with onset around 7:30 p.m. Increased nausea, legs, and low back pain. History of diabetes.  EXAM: MRI ABDOMEN WITHOUT CONTRAST  TECHNIQUE: Multiplanar multisequence MR imaging was performed without the administration of intravenous contrast.  COMPARISON:  Ultrasound pelvis 06/17/2015  FINDINGS: An intrauterine pregnancy is demonstrated. Fetus is visualized within anterior placenta present. Simple appearing cyst in the right ovary measuring 2.7 cm diameter, likely representing corpus luteal cyst. Left ovary is unremarkable. No abnormal adnexal masses. Bladder wall is not thickened. No free pelvic fluid collections. No loculated fluid collections. No pelvic mass or lymphadenopathy. The appendix is identified and appears normal. No periappendiceal inflammation or fluid. No evidence of acute appendicitis. Scattered diverticula in the sigmoid colon without evidence of diverticulitis.  The liver, spleen, pancreas, adrenal glands, abdominal aorta, inferior vena cava, and retroperitoneal lymph nodes are unremarkable. No bile duct dilatation. Gallbladder is not identified and may be surgically absent or physiologically contracted. Stomach, small bowel, and colon are not abnormally distended. No hydronephrosis in either kidney. Tiny cyst in the left kidney. No free fluid in the abdomen.  IMPRESSION: Normal appendix. No evidence of appendicitis. Intrauterine pregnancy demonstrated. Corpus luteal cyst in the right  ovary. No evidence of bowel obstruction or acute inflammatory process.   Electronically Signed   By: Burman Nieves M.D.   On: 07/19/2015 02:39      Shon Baton, MD 07/19/15 [phone]

## 2015-07-19 NOTE — ED Notes (Signed)
Pt left with all her belongings and was wheeled out of the treatment area with her husband.  

## 2015-07-20 LAB — GC/CHLAMYDIA PROBE AMP (~~LOC~~) NOT AT ARMC
Chlamydia: NEGATIVE
Neisseria Gonorrhea: NEGATIVE

## 2015-07-21 ENCOUNTER — Ambulatory Visit (INDEPENDENT_AMBULATORY_CARE_PROVIDER_SITE_OTHER): Payer: 59 | Admitting: Obstetrics & Gynecology

## 2015-07-21 ENCOUNTER — Encounter: Payer: Self-pay | Admitting: Obstetrics & Gynecology

## 2015-07-21 VITALS — BP 123/74 | HR 85 | Temp 97.4°F | Wt 249.0 lb

## 2015-07-21 DIAGNOSIS — O219 Vomiting of pregnancy, unspecified: Secondary | ICD-10-CM

## 2015-07-21 DIAGNOSIS — O09899 Supervision of other high risk pregnancies, unspecified trimester: Secondary | ICD-10-CM | POA: Diagnosis not present

## 2015-07-21 DIAGNOSIS — O24319 Unspecified pre-existing diabetes mellitus in pregnancy, unspecified trimester: Secondary | ICD-10-CM

## 2015-07-21 DIAGNOSIS — O34219 Maternal care for unspecified type scar from previous cesarean delivery: Secondary | ICD-10-CM

## 2015-07-21 DIAGNOSIS — E119 Type 2 diabetes mellitus without complications: Secondary | ICD-10-CM

## 2015-07-21 MED ORDER — METFORMIN HCL 1000 MG PO TABS
1000.0000 mg | ORAL_TABLET | Freq: Two times a day (BID) | ORAL | Status: DC
Start: 2015-07-21 — End: 2015-07-21

## 2015-07-21 MED ORDER — PROMETHAZINE HCL 25 MG RE SUPP: 25.0000 mg | Freq: Four times a day (QID) | RECTAL | Status: DC | PRN

## 2015-07-21 MED ORDER — METFORMIN HCL 1000 MG PO TABS: 1000.0000 mg | ORAL_TABLET | Freq: Every day | ORAL | Status: DC

## 2015-07-21 MED ORDER — METRONIDAZOLE 500 MG PO TABS: 500.0000 mg | ORAL_TABLET | Freq: Two times a day (BID) | ORAL | Status: DC

## 2015-07-21 NOTE — Progress Notes (Signed)
Nausea and vomiting with lower abdominal pain. Pt c/o headache.  Pt diagnosed wet BV on 07/19/15 but has not been treated.  See note Ketones-Small

## 2015-07-22 LAB — ALPHA FETOPROTEIN, MATERNAL
AFP: 27.4 ng/mL
Curr Gest Age: 18.1 wks.days
MOM FOR AFP: 0.9
OPEN SPINA BIFIDA: NEGATIVE
Osb Risk: 1:18600 {titer}

## 2015-07-25 ENCOUNTER — Encounter: Payer: 59 | Admitting: Obstetrics & Gynecology

## 2015-07-29 ENCOUNTER — Ambulatory Visit (HOSPITAL_COMMUNITY)
Admission: RE | Admit: 2015-07-29 | Discharge: 2015-07-29 | Disposition: A | Discharge status: Home / Self Care | Payer: 59 | Source: Ambulatory Visit | Attending: Obstetrics & Gynecology | Admitting: Obstetrics & Gynecology

## 2015-07-29 ENCOUNTER — Encounter (HOSPITAL_COMMUNITY): Payer: Self-pay

## 2015-07-29 ENCOUNTER — Other Ambulatory Visit (HOSPITAL_COMMUNITY): Payer: Self-pay | Admitting: Obstetrics and Gynecology

## 2015-07-29 VITALS — BP 113/59 | HR 85 | Wt 252.8 lb

## 2015-07-29 DIAGNOSIS — E7212 Methylenetetrahydrofolate reductase deficiency: Secondary | ICD-10-CM

## 2015-07-29 DIAGNOSIS — O99280 Endocrine, nutritional and metabolic diseases complicating pregnancy, unspecified trimester: Secondary | ICD-10-CM | POA: Insufficient documentation

## 2015-07-29 DIAGNOSIS — O24319 Unspecified pre-existing diabetes mellitus in pregnancy, unspecified trimester: Secondary | ICD-10-CM

## 2015-07-29 NOTE — ED Notes (Signed)
C/O RLQ pain.

## 2015-08-01 NOTE — Progress Notes (Signed)
This is my note from 07/21/15. FH at umbilicus. Anatomy u/s. RTC 4 weeks.

## 2015-08-15 ENCOUNTER — Encounter: Payer: 59 | Admitting: Obstetrics & Gynecology

## 2015-08-22 ENCOUNTER — Ambulatory Visit (INDEPENDENT_AMBULATORY_CARE_PROVIDER_SITE_OTHER): Payer: 59 | Admitting: Family Medicine

## 2015-08-22 VITALS — BP 132/78 | HR 95 | Wt 250.0 lb

## 2015-08-22 DIAGNOSIS — E119 Type 2 diabetes mellitus without complications: Secondary | ICD-10-CM

## 2015-08-22 DIAGNOSIS — O09899 Supervision of other high risk pregnancies, unspecified trimester: Secondary | ICD-10-CM

## 2015-08-22 DIAGNOSIS — O24319 Unspecified pre-existing diabetes mellitus in pregnancy, unspecified trimester: Secondary | ICD-10-CM

## 2015-08-22 DIAGNOSIS — O34219 Maternal care for unspecified type scar from previous cesarean delivery: Secondary | ICD-10-CM

## 2015-08-22 MED ORDER — GLYBURIDE 5 MG PO TABS: 2.5000 mg | ORAL_TABLET | Freq: Two times a day (BID) | ORAL | Status: DC

## 2015-08-22 NOTE — Progress Notes (Signed)
Subjective:  Margaret Cabrera is a 34 y.o. [id number] at 8372w5d being seen today for ongoing prenatal care.  Patient reports no complaints.  Contractions: Not present.  Vag. Bleeding: None.  . Denies leaking of fluid.   DM2: Patient taking metformin at night.  Reports no hypoglycemic episodes.  Tolerating medication well Fasting: 90-106 2hr PP: controlled   The following portions of the patient's history were reviewed and updated as appropriate: allergies, current medications, past family history, past medical history, past social history, past surgical history and problem list. Problem list updated.  Objective:   Filed Vitals:   08/22/15 0913  BP: 132/78  Pulse: 95  Weight: 250 lb (113.399 kg)    Fetal Status: Fetal Heart Rate (bpm): 140 lb         General:  Alert, oriented and cooperative. Patient is in no acute distress.  Skin: Skin is warm and dry. No rash noted.   Cardiovascular: Normal heart rate noted  Respiratory: Normal respiratory effort, no problems with respiration noted  Abdomen: Soft, gravid, appropriate for gestational age. Pain/Pressure: Absent     Pelvic: Vag. Bleeding: None Vag D/C Character: Thin   Cervical exam deferred        Extremities: Normal range of motion.  Edema: None  Mental Status: Normal mood and affect. Normal behavior. Normal judgment and thought content.   Urinalysis: Urine Protein: Trace Urine Glucose: Negative  Assessment and Plan:  Pregnancy: [id number] at 972w5d  1. High risk multigravida, antepartum FHT normal, FH normal.  US normal, incomplete views of spine and diaphragm.  2. Maternal pregestational diabetes classes B through R, antepartum (HCC) Start glyburide 2.5mg  at bedtime for elevated blood sugars.  Patient ot call if fasting CBGs not less than 90.  3. History of cesarean delivery, antepartum Patient desires repeat cesarean  Preterm labor symptoms and general obstetric precautions including but not limited to vaginal bleeding,  contractions, leaking of fluid and fetal movement were reviewed in detail with the patient. Please refer to After Visit Summary for other counseling recommendations.  No Follow-up on file.   Levie HeritageJacob J Stinson, DO

## 2015-08-22 NOTE — Patient Instructions (Signed)

## 2015-08-25 NOTE — ED Provider Notes (Signed)
CSN: [id number]     Arrival date & time 07/18/15  2145 History   First MD Initiated Contact with Patient 07/18/15 2305     Chief Complaint  Patient presents with  . Abdominal Pain  . [redacted] Weeks Pregnant      (Consider location/radiation/quality/duration/timing/severity/associated sxs/prior Treatment) HPI Comments: Pt is pregnant and comes in with abd pain. RLQ, sharp, moderately severe. + nausea. Pt has no uti like sx. No cramping pain. Pain is constant. Pain is not cramping. There is no vaginal discharge. No trauma.    ROS 10 Systems reviewed and are negative for acute change except as noted in the HPI.      Patient is a 34 y.o. female presenting with abdominal pain. The history is provided by the patient.  Abdominal Pain Pain location:  Periumbilical   Past Medical History  Diagnosis Date  . Diabetes mellitus without complication ([place])   . Vaginal Pap smear, abnormal   . Preterm labor   . MTHFR mutation [facility])    Past Surgical History  Procedure Laterality Date  . Dilation and curettage of uterus  09-2007  . Dilation and curettage of uterus  11-2011  . Tonsillectomy  1987  . Cesarean section  03-2013  . Cryotherapy     Family History  Problem Relation Age of Onset  . Cancer Father   . Hypertension Father   . Heart disease Father   . Diabetes Father   . Stroke Brother   . Diabetes Brother   . Stroke Maternal Grandmother    Social History  Substance Use Topics  . Smoking status: Never Smoker   . Smokeless tobacco: None  . Alcohol Use: No   OB History    Gravida Para Term Preterm AB TAB SAB Ectopic Multiple Living   11 1 1  9 1 8   1      Review of Systems  Gastrointestinal: Positive for abdominal pain.      Allergies  Prednisone; Codeine; and Sulfa antibiotics  Home Medications   Prior to Admission medications   Medication Sig Start Date End Date Taking? Authorizing Provider  albuterol (PROVENTIL HFA;VENTOLIN HFA) 108 (90 BASE) MCG/ACT inhaler  Inhale 1 puff into the lungs every 6 (six) hours as needed for wheezing or shortness of breath.   Yes Historical Provider, MD  albuterol (PROVENTIL) (2.5 MG/3ML) 0.083% nebulizer solution Take 2.5 mg by nebulization every 6 (six) hours as needed for wheezing.    Yes Historical Provider, MD  aspirin 81 MG EC tablet Take 81 mg by mouth daily.    Yes Historical Provider, MD  Prenat w/oA-FeFum-Methf-Omegas (NEEVO DHA PO) Take 1 tablet by mouth daily.    Yes Historical Provider, MD  Blood Glucose Monitoring Suppl KIT Use device to check blood sugars 1-2 times daily. Please provide what is covered by insurance 08/30/14   Historical Provider, MD  fluticasone (FLONASE) 50 MCG/ACT nasal spray Place 2 sprays into both nostrils daily. 06/27/15   Seabron Spates, CNM  glucose blood test strip Use device to check blood sugars 1-2 times daily. Please provide what is covered by insurance 11/18/14   Historical Provider, MD  glyBURIDE (DIABETA) 5 MG tablet Take 0.5 tablets (2.5 mg total) by mouth 2 (two) times daily with a meal. 08/22/15   Truett Mainland, DO  Lancets 28G MISC Use device to check blood sugars 1-2 times daily. Please provide what is covered by insurance 11/18/14   Historical Provider, MD  metFORMIN (GLUCOPHAGE) 1000 MG tablet  Take 1 tablet (1,000 mg total) by mouth daily. 07/21/15   Emily Filbert, MD  metroNIDAZOLE (FLAGYL) 500 MG tablet Take 1 tablet (500 mg total) by mouth 2 (two) times daily. Patient not taking: Reported on 08/22/2015 07/21/15   Emily Filbert, MD  promethazine (PHENERGAN) 25 MG suppository Place 1 suppository (25 mg total) rectally every 6 (six) hours as needed for nausea or vomiting. 07/21/15   Myra Marijo Sanes, MD   BP 132/69 mmHg  Pulse 77  Temp(Src) 99 F (37.2 C) (Oral)  Resp 18  Ht 5' 5.5" (1.664 m)  Wt 248 lb (112.492 kg)  BMI 40.63 kg/m2  SpO2 98%  LMP 02/06/2015 Physical Exam  Constitutional: She is oriented to person, place, and time. She appears well-developed and  well-nourished.  HENT:  Head: Normocephalic and atraumatic.  Eyes: EOM are normal. Pupils are equal, round, and reactive to light.  Neck: Neck supple.  Cardiovascular: Normal rate, regular rhythm and normal heart sounds.   No murmur heard. Pulmonary/Chest: Effort normal. No respiratory distress.  Abdominal: Soft. She exhibits no distension. There is tenderness. There is no rebound and no guarding.  Neurological: She is alert and oriented to person, place, and time.  Skin: Skin is warm and dry.    ED Course  Procedures (including critical care time) Labs Review Labs Reviewed  WET PREP, GENITAL - Abnormal; Notable for the following:    Clue Cells Wet Prep HPF POC FEW (*)    WBC, Wet Prep HPF POC FEW (*)    All other components within normal limits  URINALYSIS, ROUTINE W REFLEX MICROSCOPIC (NOT AT [facility]) - Abnormal; Notable for the following:    APPearance CLOUDY (*)    All other components within normal limits  COMPREHENSIVE METABOLIC PANEL - Abnormal; Notable for the following:    Potassium 3.4 (*)    CO2 21 (*)    Glucose, Bld 102 (*)    Calcium 8.6 (*)    Albumin 3.2 (*)    AST 13 (*)    ALT 9 (*)    Total Bilirubin 0.2 (*)    All other components within normal limits  HCG, QUANTITATIVE, PREGNANCY - Abnormal; Notable for the following:    hCG, Beta Chain, Quant, S 14068 (*)    All other components within normal limits  LIPASE, BLOOD  CBC  CBG MONITORING, ED  GC/CHLAMYDIA PROBE AMP ([place]) NOT AT [facility]    Imaging Review No results found. I have personally reviewed and evaluated these images and lab results as part of my medical decision-making.   EKG Interpretation None      MDM   Final diagnoses:  Right lower quadrant abdominal pain    Pregnant pt with RLQ abd pain. OB emergencies ruled out. MRI ordered. [facility]icitis.    Varney Biles, MD 08/25/15 [id number]

## 2015-09-09 ENCOUNTER — Encounter (HOSPITAL_COMMUNITY): Payer: Self-pay

## 2015-09-09 ENCOUNTER — Other Ambulatory Visit (HOSPITAL_COMMUNITY): Payer: Self-pay | Admitting: Maternal and Fetal Medicine

## 2015-09-09 ENCOUNTER — Ambulatory Visit (HOSPITAL_COMMUNITY)
Admission: RE | Admit: 2015-09-09 | Discharge: 2015-09-09 | Disposition: A | Discharge status: Home / Self Care | Payer: 59 | Source: Ambulatory Visit | Attending: Obstetrics & Gynecology | Admitting: Obstetrics & Gynecology

## 2015-09-09 DIAGNOSIS — O99212 Obesity complicating pregnancy, second trimester: Secondary | ICD-10-CM

## 2015-09-09 DIAGNOSIS — O09522 Supervision of elderly multigravida, second trimester: Secondary | ICD-10-CM | POA: Diagnosis not present

## 2015-09-09 DIAGNOSIS — Z3A25 25 weeks gestation of pregnancy: Secondary | ICD-10-CM

## 2015-09-09 DIAGNOSIS — O24319 Unspecified pre-existing diabetes mellitus in pregnancy, unspecified trimester: Secondary | ICD-10-CM

## 2015-09-09 DIAGNOSIS — O24312 Unspecified pre-existing diabetes mellitus in pregnancy, second trimester: Secondary | ICD-10-CM | POA: Insufficient documentation

## 2015-09-09 DIAGNOSIS — O2620 Pregnancy care for patient with recurrent pregnancy loss, unspecified trimester: Secondary | ICD-10-CM | POA: Diagnosis not present

## 2015-09-09 DIAGNOSIS — O34219 Maternal care for unspecified type scar from previous cesarean delivery: Secondary | ICD-10-CM | POA: Diagnosis not present

## 2015-09-19 ENCOUNTER — Ambulatory Visit (INDEPENDENT_AMBULATORY_CARE_PROVIDER_SITE_OTHER): Payer: 59 | Admitting: Obstetrics & Gynecology

## 2015-09-19 VITALS — BP 123/67 | HR 94 | Wt 255.0 lb

## 2015-09-19 DIAGNOSIS — O09892 Supervision of other high risk pregnancies, second trimester: Secondary | ICD-10-CM

## 2015-09-19 DIAGNOSIS — E119 Type 2 diabetes mellitus without complications: Secondary | ICD-10-CM

## 2015-09-19 DIAGNOSIS — O09522 Supervision of elderly multigravida, second trimester: Secondary | ICD-10-CM

## 2015-09-19 DIAGNOSIS — O24319 Unspecified pre-existing diabetes mellitus in pregnancy, unspecified trimester: Secondary | ICD-10-CM

## 2015-09-19 DIAGNOSIS — Z3A26 26 weeks gestation of pregnancy: Secondary | ICD-10-CM

## 2015-09-19 MED ORDER — GLYBURIDE 5 MG PO TABS: 2.5000 mg | ORAL_TABLET | Freq: Every day | ORAL | Status: DC

## 2015-09-19 MED ORDER — METFORMIN HCL 1000 MG PO TABS: 1000.0000 mg | ORAL_TABLET | Freq: Every day | ORAL | Status: DC

## 2015-09-19 NOTE — Progress Notes (Signed)
Subjective:did not bring her BG values  Margaret Cabrera is a 34 y.o. [id number] at 4327w5d being seen today for ongoing prenatal care.  She is currently monitored for the following issues for this high-risk pregnancy and has Antepartum multigravida of advanced maternal age; High risk multigravida, antepartum; Recurrent pregnancy loss, antepartum condition or complication; MTHFR deficiency complicating pregnancy (HCC); Maternal pregestational diabetes classes B through R, antepartum (HCC); and History of cesarean delivery, antepartum on her problem list.  Patient reports lower leg swelling when at work standing.  Contractions: Not present. Vag. Bleeding: None.  Movement: Present. Denies leaking of fluid.   The following portions of the patient's history were reviewed and updated as appropriate: allergies, current medications, past family history, past medical history, past social history, past surgical history and problem list. Problem list updated.  Objective:   Filed Vitals:   09/19/15 0901  BP: 123/67  Pulse: 94  Weight: 255 lb (115.667 kg)    Fetal Status: Fetal Heart Rate (bpm): 140 (Simultaneous filing. User may not have seen previous data.)   Movement: Present     General:  Alert, oriented and cooperative. Patient is in no acute distress.  Skin: Skin is warm and dry. No rash noted.   Cardiovascular: Normal heart rate noted  Respiratory: Normal respiratory effort, no problems with respiration noted  Abdomen: Soft, gravid, appropriate for gestational age. Pain/Pressure: Absent     Pelvic: Vag. Bleeding: None Vag D/C Character: Thin   Cervical exam deferred        Extremities: Normal range of motion.  Edema: Trace  Mental Status: Normal mood and affect. Normal behavior. Normal judgment and thought content.   Urinalysis: Urine Protein: Negative Urine Glucose: Trace  Assessment and Plan:  Pregnancy: [id number] at 6027w5d  1. Maternal pregestational diabetes classes B through R, antepartum  (HCC) States FBS 80-100 and PP<100, continue her metformin and glyburide  2. Antepartum multigravida of advanced maternal age, second trimester Normal growth at US 12/2  Preterm labor symptoms and general obstetric precautions including but not limited to vaginal bleeding, contractions, leaking of fluid and fetal movement were reviewed in detail with the patient. Please refer to After Visit Summary for other counseling recommendations.  Return in about 2 weeks (around 10/03/2015). Refilled her glyburide and metformin  Adam PhenixJames G Arnold, MD

## 2015-09-19 NOTE — Patient Instructions (Signed)

## 2015-10-07 ENCOUNTER — Inpatient Hospital Stay (HOSPITAL_COMMUNITY)
Admission: AD | Admit: 2015-10-07 | Discharge: 2015-10-07 | Disposition: A | Discharge status: Home / Self Care | Payer: 59 | Source: Ambulatory Visit | Attending: Obstetrics & Gynecology | Admitting: Obstetrics & Gynecology

## 2015-10-07 ENCOUNTER — Encounter (HOSPITAL_COMMUNITY): Payer: Self-pay | Admitting: *Deleted

## 2015-10-07 DIAGNOSIS — Z881 Allergy status to other antibiotic agents status: Secondary | ICD-10-CM | POA: Insufficient documentation

## 2015-10-07 DIAGNOSIS — Z7982 Long term (current) use of aspirin: Secondary | ICD-10-CM | POA: Insufficient documentation

## 2015-10-07 DIAGNOSIS — Z3A29 29 weeks gestation of pregnancy: Secondary | ICD-10-CM | POA: Insufficient documentation

## 2015-10-07 DIAGNOSIS — Z7984 Long term (current) use of oral hypoglycemic drugs: Secondary | ICD-10-CM | POA: Diagnosis not present

## 2015-10-07 DIAGNOSIS — O9989 Other specified diseases and conditions complicating pregnancy, childbirth and the puerperium: Secondary | ICD-10-CM | POA: Insufficient documentation

## 2015-10-07 DIAGNOSIS — O368131 Decreased fetal movements, third trimester, fetus 1: Secondary | ICD-10-CM | POA: Diagnosis not present

## 2015-10-07 DIAGNOSIS — R42 Dizziness and giddiness: Secondary | ICD-10-CM | POA: Diagnosis not present

## 2015-10-07 DIAGNOSIS — Z888 Allergy status to other drugs, medicaments and biological substances status: Secondary | ICD-10-CM | POA: Diagnosis not present

## 2015-10-07 DIAGNOSIS — R109 Unspecified abdominal pain: Secondary | ICD-10-CM | POA: Diagnosis not present

## 2015-10-07 DIAGNOSIS — E860 Dehydration: Secondary | ICD-10-CM | POA: Diagnosis not present

## 2015-10-07 DIAGNOSIS — Z885 Allergy status to narcotic agent status: Secondary | ICD-10-CM | POA: Insufficient documentation

## 2015-10-07 DIAGNOSIS — O368130 Decreased fetal movements, third trimester, not applicable or unspecified: Secondary | ICD-10-CM | POA: Diagnosis not present

## 2015-10-07 LAB — WET PREP, GENITAL
CLUE CELLS WET PREP: NONE SEEN
Sperm: NONE SEEN
TRICH WET PREP: NONE SEEN
Yeast Wet Prep HPF POC: NONE SEEN

## 2015-10-07 LAB — CBC
HEMATOCRIT: 36.4 % (ref 36.0–46.0)
Hemoglobin: 12 g/dL (ref 12.0–15.0)
MCH: 29 pg (ref 26.0–34.0)
MCHC: 33 g/dL (ref 30.0–36.0)
MCV: 87.9 fL (ref 78.0–100.0)
PLATELETS: 223 10*3/uL (ref 150–400)
RBC: 4.14 MIL/uL (ref 3.87–5.11)
RDW: 14.9 % (ref 11.5–15.5)
WBC: 11.6 10*3/uL — AB (ref 4.0–10.5)

## 2015-10-07 LAB — COMPREHENSIVE METABOLIC PANEL
ALBUMIN: 3.1 g/dL — AB (ref 3.5–5.0)
ALK PHOS: 55 U/L (ref 38–126)
ALT: 11 U/L — ABNORMAL LOW (ref 14–54)
AST: 13 U/L — AB (ref 15–41)
Anion gap: 10 (ref 5–15)
BILIRUBIN TOTAL: 0.2 mg/dL — AB (ref 0.3–1.2)
BUN: 9 mg/dL (ref 6–20)
CALCIUM: 8.9 mg/dL (ref 8.9–10.3)
CO2: 21 mmol/L — ABNORMAL LOW (ref 22–32)
Chloride: 106 mmol/L (ref 101–111)
Creatinine, Ser: 0.6 mg/dL (ref 0.44–1.00)
GFR calc Af Amer: >60 mL/min (ref >60)
GLUCOSE: 68 mg/dL (ref 65–99)
POTASSIUM: 3.6 mmol/L (ref 3.5–5.1)
Sodium: 137 mmol/L (ref 135–145)
Total Protein: 6.3 g/dL — ABNORMAL LOW (ref 6.5–8.1)

## 2015-10-07 LAB — URINALYSIS, ROUTINE W REFLEX MICROSCOPIC
BILIRUBIN URINE: NEGATIVE
GLUCOSE, UA: NEGATIVE mg/dL
Hgb urine dipstick: NEGATIVE
KETONES UR: 15 mg/dL — AB
LEUKOCYTES UA: NEGATIVE
NITRITE: NEGATIVE
PROTEIN: NEGATIVE mg/dL
Specific Gravity, Urine: >1.03 — ABNORMAL HIGH (ref 1.005–1.030)
pH: 5.5 (ref 5.0–8.0)

## 2015-10-07 LAB — GLUCOSE, CAPILLARY
Glucose-Capillary: 65 mg/dL (ref 65–99)
Glucose-Capillary: 85 mg/dL (ref 65–99)

## 2015-10-07 NOTE — MAU Provider Note (Signed)
History     CSN: [id number]  Arrival date and time: 10/07/15 1509   First Provider Initiated Contact with Patient 10/07/15 1612      Chief Complaint  Patient presents with  . Abdominal Pain  . Dizziness  . Decreased Fetal Movement   HPI [facility] 34 y.o. [id number] _0  presents to MAU complains of decreased fetal movement, pelvic pressure, lightheadedness.  The pelvic pressure, lightheaded/not feeling herself started this am.  Her blood sugar has been fine recently but has not been checked today.  She has felt baby move once since coming to MAU.  She has discharge noted today.  She denies LOF, vaginal bleeding, dysuria, fever, URI symptoms.  She did recently complete abx course for sinus infection. OB History    Gravida Para Term Preterm AB TAB SAB Ectopic Multiple Living   _1 Past Medical History  Diagnosis Date  . Diabetes mellitus without complication ([place])   . Vaginal Pap smear, abnormal   . MTHFR mutation [facility])     Past Surgical History  Procedure Laterality Date  . Tonsillectomy  1987  . Cesarean section  03-2013  . Cryotherapy    . Dilation and curettage of uterus  09-2007  . Dilation and curettage of uterus  11-2011  . Cholecystectomy  1997    Family History  Problem Relation Age of Onset  . Cancer Father   . Hypertension Father   . Heart disease Father   . Diabetes Father   . Stroke Brother   . Diabetes Brother   . Stroke Maternal Grandmother     Social History  Substance Use Topics  . Smoking status: Never Smoker   . Smokeless tobacco: None  . Alcohol Use: No    Allergies:  Allergies  Allergen Reactions  . Prednisone Rash  . Codeine Nausea Only  . Sulfa Antibiotics Rash    Prescriptions prior to admission  Medication Sig Dispense Refill Last Dose  . acetaminophen (TYLENOL) 325 MG tablet Take 650 mg by mouth every 6 (six) hours as needed for headache.   Past Week at Unknown time  . aspirin 81 MG EC tablet Take 81  mg by mouth daily.    10/06/2015 at Unknown time  . fluticasone (FLONASE) 50 MCG/ACT nasal spray Place 2 sprays into both nostrils daily. (Patient taking differently: Place 2 sprays into both nostrils daily as needed for allergies. ) 16 g 3 Past Week at Unknown time  . glyBURIDE (DIABETA) 5 MG tablet Take 0.5 tablets (2.5 mg total) by mouth at bedtime. 45 tablet 3 10/06/2015 at Unknown time  . metFORMIN (GLUCOPHAGE) 1000 MG tablet Take 1 tablet (1,000 mg total) by mouth daily. 90 tablet 3 10/06/2015 at Unknown time  . Multiple Vitamins-Minerals (MULTIVITAMIN GUMMIES ADULT PO) Take 2 tablets by mouth daily.   Past Week at Unknown time  . Prenat w/oA-FeFum-Methf-Omegas (NEEVO DHA PO) Take 1 tablet by mouth daily.    Past Month at Unknown time  . albuterol (PROVENTIL HFA;VENTOLIN HFA) 108 (90 BASE) MCG/ACT inhaler Inhale 1 puff into the lungs every 6 (six) hours as needed for wheezing or shortness of breath.   rescue  . albuterol (PROVENTIL) (2.5 MG/3ML) 0.083% nebulizer solution Take 2.5 mg by nebulization every 6 (six) hours as needed for wheezing.    rescue  . Blood Glucose Monitoring Suppl KIT Use device to check blood sugars 1-2 times daily. Please provide what is  covered by insurance   Taking  . glucose blood test strip Use device to check blood sugars 1-2 times daily. Please provide what is covered by insurance   Taking  . Lancets 28G MISC Use device to check blood sugars 1-2 times daily. Please provide what is covered by insurance   Taking  . metroNIDAZOLE (FLAGYL) 500 MG tablet Take 1 tablet (500 mg total) by mouth 2 (two) times daily. (Patient not taking: Reported on 09/19/2015) 14 tablet 0 Not Taking  . promethazine (PHENERGAN) 25 MG suppository Place 1 suppository (25 mg total) rectally every 6 (six) hours as needed for nausea or vomiting. (Patient not taking: Reported on 10/07/2015) 12 each 1 Not Taking at Unknown time    ROS Pertinent ROS in HPI.  All other systems are negative.    Physical Exam   Blood pressure 129/72, pulse 87, temperature 97.8 F (36.6 C), temperature source Oral, resp. rate 18, height 5' 5.5" (1.664 m), weight 255 lb (115.667 kg), last menstrual period 02/06/2015, SpO2 100 %.  Physical Exam  Constitutional: She is oriented to person, place, and time. She appears well-developed and well-nourished. No distress.  HENT:  Head: Normocephalic and atraumatic.  Eyes: Conjunctivae and EOM are normal.  Neck: Normal range of motion. Neck supple.  Cardiovascular: Normal rate and normal heart sounds.   Respiratory: Effort normal and breath sounds normal. No respiratory distress.  GI: Soft. Bowel sounds are normal. She exhibits no distension. There is no tenderness.  Genitourinary:  Small amt of white homogenous discharge Cervix closed  Musculoskeletal: Normal range of motion.  Neurological: She is alert and oriented to person, place, and time.  Skin: Skin is warm and dry.  Psychiatric: She has a normal mood and affect.   Results for orders placed or performed during the hospital encounter of 10/07/15 (from the past 24 hour(s))  Urinalysis, Routine w reflex microscopic (not at [facility])     Status: Abnormal   Collection Time: 10/07/15  3:30 PM  Result Value Ref Range   Color, Urine YELLOW YELLOW   APPearance CLEAR CLEAR   Specific Gravity, Urine >1.030 (H) 1.005 - 1.030   pH 5.5 5.0 - 8.0   Glucose, UA NEGATIVE NEGATIVE mg/dL   Hgb urine dipstick NEGATIVE NEGATIVE   Bilirubin Urine NEGATIVE NEGATIVE   Ketones, ur 15 (A) NEGATIVE mg/dL   Protein, ur NEGATIVE NEGATIVE mg/dL   Nitrite NEGATIVE NEGATIVE   Leukocytes, UA NEGATIVE NEGATIVE  Wet prep, genital     Status: Abnormal   Collection Time: 10/07/15  4:30 PM  Result Value Ref Range   Yeast Wet Prep HPF POC NONE SEEN NONE SEEN   Trich, Wet Prep NONE SEEN NONE SEEN   Clue Cells Wet Prep HPF POC NONE SEEN NONE SEEN   WBC, Wet Prep HPF POC FEW (A) NONE SEEN   Sperm NONE SEEN   CBC     Status:  Abnormal   Collection Time: 10/07/15  4:46 PM  Result Value Ref Range   WBC 11.6 (H) 4.0 - 10.5 K/uL   RBC 4.14 3.87 - 5.11 MIL/uL   Hemoglobin 12.0 12.0 - 15.0 g/dL   HCT 36.4 36.0 - 46.0 %   MCV 87.9 78.0 - 100.0 fL   MCH 29.0 26.0 - 34.0 pg   MCHC 33.0 30.0 - 36.0 g/dL   RDW 14.9 11.5 - 15.5 %   Platelets 223 150 - 400 K/uL  Comprehensive metabolic panel     Status: Abnormal   Collection  Time: 10/07/15  4:46 PM  Result Value Ref Range   Sodium 137 135 - 145 mmol/L   Potassium 3.6 3.5 - 5.1 mmol/L   Chloride 106 101 - 111 mmol/L   CO2 21 (L) 22 - 32 mmol/L   Glucose, Bld 68 65 - 99 mg/dL   BUN 9 6 - 20 mg/dL   Creatinine, Ser 0.60 0.44 - 1.00 mg/dL   Calcium 8.9 8.9 - 10.3 mg/dL   Total Protein 6.3 (L) 6.5 - 8.1 g/dL   Albumin 3.1 (L) 3.5 - 5.0 g/dL   AST 13 (L) 15 - 41 U/L   ALT 11 (L) 14 - 54 U/L   Alkaline Phosphatase 55 38 - 126 U/L   Total Bilirubin 0.2 (L) 0.3 - 1.2 mg/dL   GFR calc non Af Amer >60 >60 mL/min   GFR calc Af Amer >60 >60 mL/min   Anion gap 10 5 - 15  Glucose, capillary     Status: None   Collection Time: 10/07/15  4:49 PM  Result Value Ref Range   Glucose-Capillary 65 65 - 99 mg/dL  Glucose, capillary     Status: None   Collection Time: 10/07/15  5:42 PM  Result Value Ref Range   Glucose-Capillary 85 65 - 99 mg/dL    MAU Course  Procedures  MDM CBC, CBG, EKG ordered.  CBG improved with eating peanut butter crackers.  CBC and EKG negative. NST is reactive without contractions.   No cervical dilation. Pt notes increasing fetal movement  Assessment and Plan  A:  1. Dehydration   2. Episodic lightheadedness   3. Decreased fetal movement, third trimester, not applicable or unspecified fetus    P: Discharge to home Hydrate better Continue close blood sugar monitoring F/u on 10/09/14 with OB as scheduled Patient may return to MAU as needed or if her condition were to change or worsen   Paticia Stack 10/07/2015, 4:13 PM

## 2015-10-07 NOTE — MAU Note (Signed)
Onset of dizziness this morning, hurts to walk, having lower abdominal pain, decreased fetal movement x 2 to 3 days, no movement today, denies vaginal bleeding.

## 2015-10-07 NOTE — Discharge Instructions (Signed)
Fetal Movement Counts  Patient Name: __________________________________________________ Patient Due Date: ____________________  Performing a fetal movement count is highly recommended in high-risk pregnancies, but it is good for every pregnant woman to do. Your health care provider may ask you to start counting fetal movements at 28 weeks of the pregnancy. Fetal movements often increase:  · After eating a full meal.  · After physical activity.  · After eating or drinking something sweet or cold.  · At rest.  Pay attention to when you feel the baby is most active. This will help you notice a pattern of your baby's sleep and wake cycles and what factors contribute to an increase in fetal movement. It is important to perform a fetal movement count at the same time each day when your baby is normally most active.   HOW TO COUNT FETAL MOVEMENTS  1. Find a quiet and comfortable area to sit or lie down on your left side. Lying on your left side provides the best blood and oxygen circulation to your baby.  2. Write down the day and time on a sheet of paper or in a journal.  3. Start counting kicks, flutters, swishes, rolls, or jabs in a 2-hour period. You should feel at least 10 movements within 2 hours.  4. If you do not feel 10 movements in 2 hours, wait 2-3 hours and count again. Look for a change in the pattern or not enough counts in 2 hours.  SEEK MEDICAL CARE IF:  · You feel less than 10 counts in 2 hours, tried twice.  · There is no movement in over an hour.  · The pattern is changing or taking longer each day to reach 10 counts in 2 hours.  · You feel the baby is not moving as he or she usually does.  Date: ____________ Movements: ____________ Start time: ____________ Finish time: ____________   Date: ____________ Movements: ____________ Start time: ____________ Finish time: ____________  Date: ____________ Movements: ____________ Start time: ____________ Finish time: ____________  Date: ____________ Movements:  ____________ Start time: ____________ Finish time: ____________  Date: ____________ Movements: ____________ Start time: ____________ Finish time: ____________  Date: ____________ Movements: ____________ Start time: ____________ Finish time: ____________  Date: ____________ Movements: ____________ Start time: ____________ Finish time: ____________  Date: ____________ Movements: ____________ Start time: ____________ Finish time: ____________   Date: ____________ Movements: ____________ Start time: ____________ Finish time: ____________  Date: ____________ Movements: ____________ Start time: ____________ Finish time: ____________  Date: ____________ Movements: ____________ Start time: ____________ Finish time: ____________  Date: ____________ Movements: ____________ Start time: ____________ Finish time: ____________  Date: ____________ Movements: ____________ Start time: ____________ Finish time: ____________  Date: ____________ Movements: ____________ Start time: ____________ Finish time: ____________  Date: ____________ Movements: ____________ Start time: ____________ Finish time: ____________   Date: ____________ Movements: ____________ Start time: ____________ Finish time: ____________  Date: ____________ Movements: ____________ Start time: ____________ Finish time: ____________  Date: ____________ Movements: ____________ Start time: ____________ Finish time: ____________  Date: ____________ Movements: ____________ Start time: ____________ Finish time: ____________  Date: ____________ Movements: ____________ Start time: ____________ Finish time: ____________  Date: ____________ Movements: ____________ Start time: ____________ Finish time: ____________  Date: ____________ Movements: ____________ Start time: ____________ Finish time: ____________   Date: ____________ Movements: ____________ Start time: ____________ Finish time: ____________  Date: ____________ Movements: ____________ Start time: ____________ Finish  time: ____________  Date: ____________ Movements: ____________ Start time: ____________ Finish time: ____________  Date: ____________ Movements: ____________ Start time:   ____________ Finish time: ____________  Date: ____________ Movements: ____________ Start time: ____________ Finish time: ____________  Date: ____________ Movements: ____________ Start time: ____________ Finish time: ____________  Date: ____________ Movements: ____________ Start time: ____________ Finish time: ____________   Date: ____________ Movements: ____________ Start time: ____________ Finish time: ____________  Date: ____________ Movements: ____________ Start time: ____________ Finish time: ____________  Date: ____________ Movements: ____________ Start time: ____________ Finish time: ____________  Date: ____________ Movements: ____________ Start time: ____________ Finish time: ____________  Date: ____________ Movements: ____________ Start time: ____________ Finish time: ____________  Date: ____________ Movements: ____________ Start time: ____________ Finish time: ____________  Date: ____________ Movements: ____________ Start time: ____________ Finish time: ____________   Date: ____________ Movements: ____________ Start time: ____________ Finish time: ____________  Date: ____________ Movements: ____________ Start time: ____________ Finish time: ____________  Date: ____________ Movements: ____________ Start time: ____________ Finish time: ____________  Date: ____________ Movements: ____________ Start time: ____________ Finish time: ____________  Date: ____________ Movements: ____________ Start time: ____________ Finish time: ____________  Date: ____________ Movements: ____________ Start time: ____________ Finish time: ____________  Date: ____________ Movements: ____________ Start time: ____________ Finish time: ____________   Date: ____________ Movements: ____________ Start time: ____________ Finish time: ____________  Date: ____________  Movements: ____________ Start time: ____________ Finish time: ____________  Date: ____________ Movements: ____________ Start time: ____________ Finish time: ____________  Date: ____________ Movements: ____________ Start time: ____________ Finish time: ____________  Date: ____________ Movements: ____________ Start time: ____________ Finish time: ____________  Date: ____________ Movements: ____________ Start time: ____________ Finish time: ____________  Date: ____________ Movements: ____________ Start time: ____________ Finish time: ____________   Date: ____________ Movements: ____________ Start time: ____________ Finish time: ____________  Date: ____________ Movements: ____________ Start time: ____________ Finish time: ____________  Date: ____________ Movements: ____________ Start time: ____________ Finish time: ____________  Date: ____________ Movements: ____________ Start time: ____________ Finish time: ____________  Date: ____________ Movements: ____________ Start time: ____________ Finish time: ____________  Date: ____________ Movements: ____________ Start time: ____________ Finish time: ____________     This information is not intended to replace advice given to you by your health care provider. Make sure you discuss any questions you have with your health care provider.     Document Released: 10/24/2006 Document Revised: 10/15/2014 Document Reviewed: 07/21/2012  [employer] Interactive Patient Education ©2016 [employer]

## 2015-10-10 ENCOUNTER — Encounter (HOSPITAL_COMMUNITY): Payer: Self-pay

## 2015-10-10 ENCOUNTER — Ambulatory Visit (INDEPENDENT_AMBULATORY_CARE_PROVIDER_SITE_OTHER): Payer: 59 | Admitting: Obstetrics & Gynecology

## 2015-10-10 VITALS — BP 113/74 | HR 91 | Wt 254.1 lb

## 2015-10-10 DIAGNOSIS — O24319 Unspecified pre-existing diabetes mellitus in pregnancy, unspecified trimester: Secondary | ICD-10-CM

## 2015-10-10 DIAGNOSIS — O09523 Supervision of elderly multigravida, third trimester: Secondary | ICD-10-CM

## 2015-10-10 DIAGNOSIS — O09899 Supervision of other high risk pregnancies, unspecified trimester: Secondary | ICD-10-CM

## 2015-10-10 DIAGNOSIS — O34219 Maternal care for unspecified type scar from previous cesarean delivery: Secondary | ICD-10-CM

## 2015-10-10 DIAGNOSIS — E119 Type 2 diabetes mellitus without complications: Secondary | ICD-10-CM

## 2015-10-10 DIAGNOSIS — O2620 Pregnancy care for patient with recurrent pregnancy loss, unspecified trimester: Secondary | ICD-10-CM

## 2015-10-10 DIAGNOSIS — E7212 Methylenetetrahydrofolate reductase deficiency: Secondary | ICD-10-CM

## 2015-10-10 DIAGNOSIS — O99283 Endocrine, nutritional and metabolic diseases complicating pregnancy, third trimester: Secondary | ICD-10-CM

## 2015-10-10 MED ORDER — FLUCONAZOLE 150 MG PO TABS: 150.0000 mg | ORAL_TABLET | Freq: Once | ORAL | Status: DC

## 2015-10-10 NOTE — Progress Notes (Signed)
Subjective:  Margaret Cabrera is a 35 y.o. MW [phone] (2 1/36 yo daughter) at 354w5d being seen today for ongoing prenatal care.  She is currently monitored for the following issues for this high-risk pregnancy and has Antepartum multigravida of advanced maternal age; High risk multigravida, antepartum; Recurrent pregnancy loss, antepartum condition or complication; MTHFR deficiency complicating pregnancy (HCC); Maternal pregestational diabetes classes B through R, antepartum (HCC); and History of cesarean delivery, antepartum on her problem list.  Patient reports no complaints.  Contractions: Not present. Vag. Bleeding: None.  Movement: Present. Denies leaking of fluid.   The following portions of the patient's history were reviewed and updated as appropriate: allergies, current medications, past family history, past medical history, past social history, past surgical history and problem list. Problem list updated.  Objective:   Filed Vitals:   10/10/15 0914  BP: 113/74  Pulse: 91  Weight: 254 lb 1.3 oz (115.25 kg)    Fetal Status: Fetal Heart Rate (bpm): 136   Movement: Present     General:  Alert, oriented and cooperative. Patient is in no acute distress.  Skin: Skin is warm and dry. No rash noted.   Cardiovascular: Normal heart rate noted  Respiratory: Normal respiratory effort, no problems with respiration noted  Abdomen: Soft, gravid, appropriate for gestational age. Pain/Pressure: Present     Pelvic: Vag. Bleeding: None Vag D/C Character: Thin   Cervical exam deferred        Extremities: Normal range of motion.  Edema: Trace  Mental Status: Normal mood and affect. Normal behavior. Normal judgment and thought content.   Urinalysis: Urine Protein: Negative Urine Glucose: Trace  Assessment and Plan:  Pregnancy: [id number] at 9054w5d  1. Recurrent pregnancy loss, antepartum condition or complication   2. MTHFR deficiency complicating pregnancy, third trimester (HCC)   3. Maternal  pregestational diabetes classes B through R, antepartum (HCC) -Excellent sugars -F/U u/s with MFM in 2 weeks  4. Antepartum multigravida of advanced maternal age, third trimester   5. History of cesarean delivery, antepartum - She desires a RLTCS and BTL  6. High risk multigravida, antepartum - HIV antibody - RPR - CBC - TSH  Preterm labor symptoms and general obstetric precautions including but not limited to vaginal bleeding, contractions, leaking of fluid and fetal movement were reviewed in detail with the patient. Please refer to After Visit Summary for other counseling recommendations.  Return in about 2 weeks (around 10/24/2015).   [name], MD

## 2015-10-11 LAB — CBC
HEMATOCRIT: 35.8 % — AB (ref 36.0–46.0)
HEMOGLOBIN: 12.2 g/dL (ref 12.0–15.0)
MCH: 29.3 pg (ref 26.0–34.0)
MCHC: 34.1 g/dL (ref 30.0–36.0)
MCV: 86.1 fL (ref 78.0–100.0)
MPV: 9.8 fL (ref 8.6–12.4)
Platelets: 255 10*3/uL (ref 150–400)
RBC: 4.16 MIL/uL (ref 3.87–5.11)
RDW: 15.2 % (ref 11.5–15.5)
WBC: 8.9 10*3/uL (ref 4.0–10.5)

## 2015-10-11 LAB — TSH: TSH: 2.277 u[IU]/mL (ref 0.350–4.500)

## 2015-10-12 LAB — HIV ANTIBODY (ROUTINE TESTING W REFLEX): HIV 1&2 Ab, 4th Generation: NONREACTIVE

## 2015-10-13 LAB — RPR

## 2015-10-21 ENCOUNTER — Other Ambulatory Visit (HOSPITAL_COMMUNITY): Payer: Self-pay | Admitting: Obstetrics and Gynecology

## 2015-10-21 ENCOUNTER — Ambulatory Visit (HOSPITAL_COMMUNITY)
Admission: RE | Admit: 2015-10-21 | Discharge: 2015-10-21 | Disposition: A | Discharge status: Home / Self Care | Payer: 59 | Source: Ambulatory Visit | Attending: Obstetrics & Gynecology | Admitting: Obstetrics & Gynecology

## 2015-10-21 DIAGNOSIS — O24313 Unspecified pre-existing diabetes mellitus in pregnancy, third trimester: Secondary | ICD-10-CM

## 2015-10-21 DIAGNOSIS — O09522 Supervision of elderly multigravida, second trimester: Secondary | ICD-10-CM

## 2015-10-21 DIAGNOSIS — O34219 Maternal care for unspecified type scar from previous cesarean delivery: Secondary | ICD-10-CM | POA: Insufficient documentation

## 2015-10-21 DIAGNOSIS — O99213 Obesity complicating pregnancy, third trimester: Secondary | ICD-10-CM | POA: Diagnosis not present

## 2015-10-21 DIAGNOSIS — O09523 Supervision of elderly multigravida, third trimester: Secondary | ICD-10-CM | POA: Diagnosis not present

## 2015-10-21 DIAGNOSIS — O2623 Pregnancy care for patient with recurrent pregnancy loss, third trimester: Secondary | ICD-10-CM

## 2015-10-21 DIAGNOSIS — Z3A31 31 weeks gestation of pregnancy: Secondary | ICD-10-CM | POA: Diagnosis not present

## 2015-10-24 ENCOUNTER — Encounter: Payer: Self-pay | Admitting: Obstetrics & Gynecology

## 2015-10-24 ENCOUNTER — Ambulatory Visit (INDEPENDENT_AMBULATORY_CARE_PROVIDER_SITE_OTHER): Payer: Managed Care, Other (non HMO) | Admitting: Obstetrics & Gynecology

## 2015-10-24 VITALS — BP 118/69 | HR 90 | Wt 254.0 lb

## 2015-10-24 DIAGNOSIS — O09899 Supervision of other high risk pregnancies, unspecified trimester: Secondary | ICD-10-CM

## 2015-10-24 DIAGNOSIS — O24319 Unspecified pre-existing diabetes mellitus in pregnancy, unspecified trimester: Secondary | ICD-10-CM

## 2015-10-24 DIAGNOSIS — E7212 Methylenetetrahydrofolate reductase deficiency: Secondary | ICD-10-CM

## 2015-10-24 DIAGNOSIS — O09523 Supervision of elderly multigravida, third trimester: Secondary | ICD-10-CM

## 2015-10-24 DIAGNOSIS — O09893 Supervision of other high risk pregnancies, third trimester: Secondary | ICD-10-CM

## 2015-10-24 DIAGNOSIS — E119 Type 2 diabetes mellitus without complications: Secondary | ICD-10-CM

## 2015-10-24 DIAGNOSIS — O34219 Maternal care for unspecified type scar from previous cesarean delivery: Secondary | ICD-10-CM

## 2015-10-24 DIAGNOSIS — O99283 Endocrine, nutritional and metabolic diseases complicating pregnancy, third trimester: Secondary | ICD-10-CM

## 2015-10-24 NOTE — Progress Notes (Signed)
Subjective:  Margaret Cabrera is a 35 y.o. [id number] at 28104w5d being seen today for ongoing prenatal care.  She is currently monitored for the following issues for this high-risk pregnancy and has Antepartum multigravida of advanced maternal age; High risk multigravida, antepartum; Recurrent pregnancy loss, antepartum condition or complication; MTHFR deficiency complicating pregnancy (HCC); Maternal pregestational diabetes classes B through R, antepartum (HCC); and History of cesarean delivery, antepartum on her problem list.  Patient reports no complaints.  Contractions: Not present. Vag. Bleeding: None.  Movement: Present. Denies leaking of fluid.   The following portions of the patient's history were reviewed and updated as appropriate: allergies, current medications, past family history, past medical history, past social history, past surgical history and problem list. Problem list updated.  Objective:   Filed Vitals:   10/24/15 0902  BP: 118/69  Pulse: 90  Weight: 254 lb (115.214 kg)    Fetal Status: Fetal Heart Rate (bpm): 166 Fundal Height: 34 cm Movement: Present     General:  Alert, oriented and cooperative. Patient is in no acute distress.  Skin: Skin is warm and dry. No rash noted.   Cardiovascular: Normal heart rate noted  Respiratory: Normal respiratory effort, no problems with respiration noted  Abdomen: Soft, gravid, appropriate for gestational age. Pain/Pressure: Present     Pelvic: Vag. Bleeding: None Vag D/C Character: Thin   Cervical exam deferred        Extremities: Normal range of motion.  Edema: Trace  Mental Status: Normal mood and affect. Normal behavior. Normal judgment and thought content.   Urinalysis: Urine Protein: Negative Urine Glucose: Trace  Assessment and Plan:  Pregnancy: [id number] at 57104w5d  1. High risk multigravida, antepartum  2. Antepartum multigravida of advanced maternal age, third trimester  3. History of cesarean delivery, antepartum Pt  requests repeat c-section with BTL in lieu of labor.  Pt needs to sign consent at next visit Request for c-section date sent  4. Maternal pregestational diabetes classes B through R, antepartum (HCC) Glc log review and all except 2 results were within normal range.  5. Consult for sterilization:  Explained to pt.  She expresses understanding that this is a permanent procedure and is not considered reversible.    Preterm labor symptoms and general obstetric precautions including but not limited to vaginal bleeding, contractions, leaking of fluid and fetal movement were reviewed in detail with the patient. Please refer to After Visit Summary for other counseling recommendations.  Return in about 2 weeks (around 11/07/2015).   Willodean Rosenthalarolyn Harraway-Smith, MD

## 2015-10-24 NOTE — Patient Instructions (Signed)
Postpartum Tubal Ligation Postpartum tubal ligation (PPTL) is a procedure that closes the fallopian tubes right after childbirth or 1-2 days after childbirth. PPTL is done before the uterus returns to its normal location. The procedure is also called a mini-laparotomy. When the fallopian tubes are closed, the eggs that are released from the ovaries cannot enter the uterus, and sperm cannot reach the egg. PPTL is done so you will not be able to get pregnant or have a baby. Although this procedure may be undone (reversed), it should be considered permanent and irreversible. If you want to have future pregnancies, you should not have this procedure. LET YOUR HEALTH CARE PROVIDER KNOW ABOUT:  Any allergies you have.  All medicines you are taking, including vitamins, herbs, eye drops, creams, and over-the-counter medicines. This includes any use of steroids, either by mouth or in cream form.  Previous problems you or members of your family have had with the use of anesthetics.  Any blood disorders you have.  Previous surgeries you have had.  Any medical conditions you may have. RISKS AND COMPLICATIONS  Infection.  Bleeding.  Injury to surrounding organs.  Side effects from anesthetics.  Failure of the procedure.  Ectopic pregnancy.  Future regret about having the procedure done. BEFORE THE PROCEDURE  You may need to sign certain documents, including an informed consent form, up to 30 days before the date of your tubal ligation.  Follow instructions from your health care provider about eating and drinking restrictions. PROCEDURE  If done 1-2 days after a vaginal delivery:  You will be given one or more of the following:  A medicine that helps you relax (sedative).  A medicine that numbs the area (local anesthetic).  A medicine that makes you fall asleep (general anesthetic).  A medicine that is injected into an area of your body that numbs everything below the injection site  (regional anesthetic).  If you have been given general anesthetic, a tube will be put down your throat to help you breathe.  Your bladder may be emptied with a small tube (catheter).  A small cut (incision) will be made just above the pubic hair line.  The fallopian tubes will be located and brought up through the incision.  The fallopian tubes will be tied off or burned (cauterized), or they will be closed with a clamp, ring, or clip. In many cases, a small portion in the center of each fallopian tube will also be removed.  The incision will be closed with stitches (sutures).  A bandage (dressing) will be placed over the incision. The procedure may vary among health care providers and hospitals. If done after a cesarean delivery:  Tubal ligation will be done through the incision that was used for the cesarean delivery of your baby.  After the tubes are closed, the incision will be closed with stitches (sutures).  A bandage (dressing) will be placed over the incision. The procedure may vary among health care providers and hospitals. AFTER THE PROCEDURE  Your blood pressure, heart rate, breathing rate, and blood oxygen level will be monitored often until the medicines you were given have worn off.  You will be given pain medicine as needed.  If you had general anesthetic, you may have some mild discomfort in your throat. This is from the breathing tube that was placed in your throat while you were sleeping.  You may feel tired, and you should rest for the remainder of the day.  You may have some pain   or cramps in the abdominal area for 3-7 days.   This information is not intended to replace advice given to you by your health care provider. Make sure you discuss any questions you have with your health care provider.   Document Released: 09/24/2005 Document Revised: 02/08/2015 Document Reviewed: 01/05/2012 Elsevier Interactive Patient Education 2016 [employer]  

## 2015-10-25 ENCOUNTER — Encounter (HOSPITAL_COMMUNITY): Payer: Self-pay | Admitting: *Deleted

## 2015-10-27 ENCOUNTER — Encounter: Payer: Self-pay | Admitting: *Deleted

## 2015-10-28 ENCOUNTER — Encounter: Payer: Self-pay | Admitting: Family Medicine

## 2015-10-31 ENCOUNTER — Telehealth: Payer: Self-pay

## 2015-10-31 ENCOUNTER — Ambulatory Visit (INDEPENDENT_AMBULATORY_CARE_PROVIDER_SITE_OTHER): Payer: 59 | Admitting: Obstetrics & Gynecology

## 2015-10-31 VITALS — BP 139/82 | HR 102 | Wt 258.0 lb

## 2015-10-31 DIAGNOSIS — O139 Gestational [pregnancy-induced] hypertension without significant proteinuria, unspecified trimester: Secondary | ICD-10-CM

## 2015-10-31 DIAGNOSIS — E119 Type 2 diabetes mellitus without complications: Secondary | ICD-10-CM

## 2015-10-31 DIAGNOSIS — O24319 Unspecified pre-existing diabetes mellitus in pregnancy, unspecified trimester: Secondary | ICD-10-CM | POA: Diagnosis not present

## 2015-10-31 LAB — COMPREHENSIVE METABOLIC PANEL
ALT: 9 U/L (ref 6–29)
AST: 8 U/L — ABNORMAL LOW (ref 10–30)
Albumin: 3.2 g/dL — ABNORMAL LOW (ref 3.6–5.1)
Alkaline Phosphatase: 58 U/L (ref 33–115)
BUN: 8 mg/dL (ref 7–25)
CHLORIDE: 105 mmol/L (ref 98–110)
CO2: 20 mmol/L (ref 20–31)
CREATININE: 0.57 mg/dL (ref 0.50–1.10)
Calcium: 8.6 mg/dL (ref 8.6–10.2)
GLUCOSE: 144 mg/dL — AB (ref 65–99)
Potassium: 3.8 mmol/L (ref 3.5–5.3)
SODIUM: 136 mmol/L (ref 135–146)
TOTAL PROTEIN: 5.8 g/dL — AB (ref 6.1–8.1)
Total Bilirubin: 0.3 mg/dL (ref 0.2–1.2)

## 2015-10-31 LAB — CBC
HCT: 36 % (ref 36.0–46.0)
Hemoglobin: 12 g/dL (ref 12.0–15.0)
MCH: 29.1 pg (ref 26.0–34.0)
MCHC: 33.3 g/dL (ref 30.0–36.0)
MCV: 87.2 fL (ref 78.0–100.0)
MPV: 9.4 fL (ref 8.6–12.4)
PLATELETS: 238 10*3/uL (ref 150–400)
RBC: 4.13 MIL/uL (ref 3.87–5.11)
RDW: 14.7 % (ref 11.5–15.5)
WBC: 6.8 10*3/uL (ref 4.0–10.5)

## 2015-10-31 NOTE — Patient Instructions (Signed)

## 2015-10-31 NOTE — Telephone Encounter (Signed)
Patient called stating she took her blood pressure several times this weekend. The highest bp had systole in 180s. Patient states she was able to sit and rest and get her blood pressure to come back down to 130s/90s Patient states baby is moving well and denies any bleeding.  Patient scheduled to come in for evaluation today at 10:30. Armandina Stammer RN BSN

## 2015-10-31 NOTE — Progress Notes (Signed)
Subjective:  Margaret Cabrera is a 35 y.o. [id number] at [redacted]w[redacted]d being seen today for report of elevated BP.  She is a Engineer, civil (consulting) and has not felt well since a couple of days;  "just didn't feel good". Did complain of hands tingling and increased edema.  She checked her BP at work and it was 140s-160s/80-90s over several occasions. Had a headache yesterday which has subsided, and some cramping which is no longer present.   No bleeding, no LOF. Good FM. She is currently monitored for the following issues for this high-risk pregnancy and has Antepartum multigravida of advanced maternal age; High risk multigravida, antepartum; Recurrent pregnancy loss, antepartum condition or complication; MTHFR deficiency complicating pregnancy (HCC); Maternal pregestational diabetes classes B through R, antepartum (HCC); History of cesarean delivery, antepartum; and Gestational hypertension, antepartum on her problem list.   Last visit was on 10/24/15.  The following portions of the patient's history were reviewed and updated as appropriate: allergies, current medications, past family history, past medical history, past social history, past surgical history and problem list. Problem list updated.  Objective:   Filed Vitals:   10/31/15 1035  BP: 139/82  Pulse: 102  Weight: 258 lb (117.028 kg)    Fetal Status: Fetal Heart Rate (bpm): 155 Fundal Height: 36 cm Movement: Present     General:  Alert, oriented and cooperative. Patient is in no acute distress.  Skin: Skin is warm and dry. No rash noted.   Cardiovascular: Normal heart rate noted  Respiratory: Normal respiratory effort, no problems with respiration noted  Abdomen: Soft, gravid, appropriate for gestational age. Pain/Pressure: Present     Pelvic: Vag. Bleeding: None Vag D/C Character: Thin   Cervical exam deferred        Extremities: Normal range of motion.  Edema: Mild pitting, slight indentation  Mental Status: Normal mood and affect. Normal behavior. Normal  judgment and thought content.   Urinalysis: Urine Protein: Negative Urine Glucose: Trace Patient did not bring CBG log today. NST performed today was reviewed and was found to be reactive.  Continue recommended antenatal testing and prenatal care.  Assessment and Plan:  Pregnancy: [id number] at [redacted]w[redacted]d  1. Gestational hypertension/possible preeclampsia, antepartum Will check labs today.  NST done today, will do BPP later this week. Had recent growth scan on 10/21/15 which showed EFW 78%. Continue antenatal testing. - [place] MFM FETAL BPP WO NON STRESS; Future - Fetal nonstress test - Protein / creatinine ratio, urine - CBC - Comprehensive metabolic panel Severe preeclampsia precautions reviewed.  2. Maternal pregestational diabetes classes B through R, antepartum (HCC) Continue antenatal testing and CBG monitoring - [place] MFM FETAL BPP WO NON STRESS; Future - Fetal nonstress test  Preterm labor symptoms and general obstetric precautions including but not limited to vaginal bleeding, contractions, leaking of fluid and fetal movement were reviewed in detail with the patient. Please refer to After Visit Summary for other counseling recommendations.  Return in about 1 week (around 11/07/2015) for OB Visit, NST.   Tereso Newcomer, MD

## 2015-11-01 LAB — PROTEIN / CREATININE RATIO, URINE
CREATININE, URINE: 101 mg/dL (ref 20–320)
Protein Creatinine Ratio: 129 mg/g creat (ref 21–161)
Total Protein, Urine: 13 mg/dL (ref 5–24)

## 2015-11-03 ENCOUNTER — Other Ambulatory Visit: Payer: Managed Care, Other (non HMO)

## 2015-11-04 ENCOUNTER — Ambulatory Visit (HOSPITAL_COMMUNITY)
Admission: RE | Admit: 2015-11-04 | Discharge: 2015-11-04 | Disposition: A | Discharge status: Home / Self Care | Payer: 59 | Source: Ambulatory Visit | Attending: Obstetrics & Gynecology | Admitting: Obstetrics & Gynecology

## 2015-11-04 ENCOUNTER — Encounter (HOSPITAL_COMMUNITY): Payer: Self-pay

## 2015-11-04 DIAGNOSIS — O24319 Unspecified pre-existing diabetes mellitus in pregnancy, unspecified trimester: Secondary | ICD-10-CM

## 2015-11-04 DIAGNOSIS — O24313 Unspecified pre-existing diabetes mellitus in pregnancy, third trimester: Secondary | ICD-10-CM | POA: Diagnosis not present

## 2015-11-04 DIAGNOSIS — O139 Gestational [pregnancy-induced] hypertension without significant proteinuria, unspecified trimester: Secondary | ICD-10-CM

## 2015-11-04 DIAGNOSIS — O09523 Supervision of elderly multigravida, third trimester: Secondary | ICD-10-CM | POA: Diagnosis not present

## 2015-11-04 DIAGNOSIS — O99213 Obesity complicating pregnancy, third trimester: Secondary | ICD-10-CM | POA: Insufficient documentation

## 2015-11-04 DIAGNOSIS — Z3A33 33 weeks gestation of pregnancy: Secondary | ICD-10-CM | POA: Insufficient documentation

## 2015-11-04 DIAGNOSIS — O34219 Maternal care for unspecified type scar from previous cesarean delivery: Secondary | ICD-10-CM | POA: Diagnosis not present

## 2015-11-07 ENCOUNTER — Encounter: Payer: Self-pay | Admitting: Obstetrics & Gynecology

## 2015-11-07 ENCOUNTER — Ambulatory Visit (INDEPENDENT_AMBULATORY_CARE_PROVIDER_SITE_OTHER): Payer: 59 | Admitting: Obstetrics & Gynecology

## 2015-11-07 ENCOUNTER — Encounter: Payer: Managed Care, Other (non HMO) | Admitting: Obstetrics & Gynecology

## 2015-11-07 ENCOUNTER — Encounter (HOSPITAL_COMMUNITY): Payer: Self-pay

## 2015-11-07 ENCOUNTER — Inpatient Hospital Stay (HOSPITAL_COMMUNITY)
Admission: AD | Admit: 2015-11-07 | Discharge: 2015-11-07 | Disposition: A | Discharge status: Home / Self Care | Payer: 59 | Source: Ambulatory Visit | Attending: Family Medicine | Admitting: Family Medicine

## 2015-11-07 VITALS — BP 157/87 | HR 99 | Wt 260.0 lb

## 2015-11-07 DIAGNOSIS — Z7984 Long term (current) use of oral hypoglycemic drugs: Secondary | ICD-10-CM | POA: Insufficient documentation

## 2015-11-07 DIAGNOSIS — O9989 Other specified diseases and conditions complicating pregnancy, childbirth and the puerperium: Secondary | ICD-10-CM

## 2015-11-07 DIAGNOSIS — R030 Elevated blood-pressure reading, without diagnosis of hypertension: Secondary | ICD-10-CM | POA: Diagnosis present

## 2015-11-07 DIAGNOSIS — R51 Headache: Secondary | ICD-10-CM | POA: Diagnosis not present

## 2015-11-07 DIAGNOSIS — E7212 Methylenetetrahydrofolate reductase deficiency: Secondary | ICD-10-CM

## 2015-11-07 DIAGNOSIS — O2623 Pregnancy care for patient with recurrent pregnancy loss, third trimester: Secondary | ICD-10-CM | POA: Diagnosis not present

## 2015-11-07 DIAGNOSIS — Z7982 Long term (current) use of aspirin: Secondary | ICD-10-CM | POA: Diagnosis not present

## 2015-11-07 DIAGNOSIS — O139 Gestational [pregnancy-induced] hypertension without significant proteinuria, unspecified trimester: Secondary | ICD-10-CM | POA: Diagnosis not present

## 2015-11-07 DIAGNOSIS — R519 Headache, unspecified: Secondary | ICD-10-CM

## 2015-11-07 DIAGNOSIS — Z3A33 33 weeks gestation of pregnancy: Secondary | ICD-10-CM | POA: Diagnosis not present

## 2015-11-07 DIAGNOSIS — O09523 Supervision of elderly multigravida, third trimester: Secondary | ICD-10-CM | POA: Insufficient documentation

## 2015-11-07 DIAGNOSIS — O34211 Maternal care for low transverse scar from previous cesarean delivery: Secondary | ICD-10-CM | POA: Diagnosis not present

## 2015-11-07 DIAGNOSIS — E119 Type 2 diabetes mellitus without complications: Secondary | ICD-10-CM | POA: Insufficient documentation

## 2015-11-07 DIAGNOSIS — O24313 Unspecified pre-existing diabetes mellitus in pregnancy, third trimester: Secondary | ICD-10-CM

## 2015-11-07 DIAGNOSIS — O24319 Unspecified pre-existing diabetes mellitus in pregnancy, unspecified trimester: Secondary | ICD-10-CM

## 2015-11-07 DIAGNOSIS — O09899 Supervision of other high risk pregnancies, unspecified trimester: Secondary | ICD-10-CM

## 2015-11-07 DIAGNOSIS — O133 Gestational [pregnancy-induced] hypertension without significant proteinuria, third trimester: Secondary | ICD-10-CM | POA: Diagnosis not present

## 2015-11-07 DIAGNOSIS — O2620 Pregnancy care for patient with recurrent pregnancy loss, unspecified trimester: Secondary | ICD-10-CM

## 2015-11-07 DIAGNOSIS — O99283 Endocrine, nutritional and metabolic diseases complicating pregnancy, third trimester: Secondary | ICD-10-CM

## 2015-11-07 DIAGNOSIS — O34219 Maternal care for unspecified type scar from previous cesarean delivery: Secondary | ICD-10-CM

## 2015-11-07 HISTORY — DX: Headache: R51

## 2015-11-07 HISTORY — DX: Unspecified asthma, uncomplicated: J45.909

## 2015-11-07 HISTORY — DX: Other specified postprocedural states: R11.2

## 2015-11-07 HISTORY — DX: Other specified postprocedural states: Z98.890

## 2015-11-07 HISTORY — DX: Headache, unspecified: R51.9

## 2015-11-07 LAB — COMPREHENSIVE METABOLIC PANEL
ALT: 10 U/L — ABNORMAL LOW (ref 14–54)
ANION GAP: 8 (ref 5–15)
AST: 13 U/L — AB (ref 15–41)
Albumin: 2.7 g/dL — ABNORMAL LOW (ref 3.5–5.0)
Alkaline Phosphatase: 58 U/L (ref 38–126)
BUN: 8 mg/dL (ref 6–20)
CHLORIDE: 107 mmol/L (ref 101–111)
CO2: 22 mmol/L (ref 22–32)
Calcium: 8.6 mg/dL — ABNORMAL LOW (ref 8.9–10.3)
Creatinine, Ser: 0.6 mg/dL (ref 0.44–1.00)
GFR calc Af Amer: >60 mL/min (ref >60)
Glucose, Bld: 111 mg/dL — ABNORMAL HIGH (ref 65–99)
POTASSIUM: 3.6 mmol/L (ref 3.5–5.1)
Sodium: 137 mmol/L (ref 135–145)
Total Bilirubin: 0.3 mg/dL (ref 0.3–1.2)
Total Protein: 6.2 g/dL — ABNORMAL LOW (ref 6.5–8.1)

## 2015-11-07 LAB — CBC
HEMATOCRIT: 33.9 % — AB (ref 36.0–46.0)
HEMOGLOBIN: 11.2 g/dL — AB (ref 12.0–15.0)
MCH: 28.9 pg (ref 26.0–34.0)
MCHC: 33 g/dL (ref 30.0–36.0)
MCV: 87.6 fL (ref 78.0–100.0)
Platelets: 218 10*3/uL (ref 150–400)
RBC: 3.87 MIL/uL (ref 3.87–5.11)
RDW: 14.9 % (ref 11.5–15.5)
WBC: 8 10*3/uL (ref 4.0–10.5)

## 2015-11-07 LAB — PROTEIN / CREATININE RATIO, URINE
CREATININE, URINE: 97 mg/dL
Protein Creatinine Ratio: 0.13 mg/mg{Cre} (ref 0.00–0.15)
TOTAL PROTEIN, URINE: 13 mg/dL

## 2015-11-07 MED ORDER — CYCLOBENZAPRINE HCL 5 MG PO TABS: 5.0000 mg | ORAL_TABLET | Freq: Three times a day (TID) | ORAL | Status: DC | PRN

## 2015-11-07 MED ORDER — BUTALBITAL-APAP-CAFFEINE 50-325-40 MG PO CAPS: 1.0000 | ORAL_CAPSULE | Freq: Four times a day (QID) | ORAL | Status: DC | PRN

## 2015-11-07 MED ORDER — BUTALBITAL-APAP-CAFFEINE 50-325-40 MG PO TABS
1.0000 | ORAL_TABLET | Freq: Once | ORAL | Status: AC
  Administered 2015-11-07: 1 via ORAL
  Filled 2015-11-07: qty 1

## 2015-11-07 MED ORDER — ACETAMINOPHEN 325 MG PO TABS
650.0000 mg | ORAL_TABLET | Freq: Once | ORAL | Status: AC
  Administered 2015-11-07: 650 mg via ORAL
  Filled 2015-11-07: qty 2

## 2015-11-07 NOTE — Discharge Instructions (Signed)

## 2015-11-07 NOTE — Progress Notes (Signed)
Subjective:  Margaret Cabrera is a 35 y.o. [id number] at [redacted]w[redacted]d being seen today for ongoing prenatal care.  She is currently monitored for the following issues for this high-risk pregnancy and has Antepartum multigravida of advanced maternal age; High risk multigravida, antepartum; Recurrent pregnancy loss, antepartum condition or complication; MTHFR deficiency complicating pregnancy (HCC); Maternal pregestational diabetes classes B through R, antepartum (HCC); History of cesarean delivery, antepartum; and Gestational hypertension, antepartum on her problem list.  Patient reports that she does not feel well.  She c/o a HA for >1 weeks that waxes and wanes but, never fully leaves.  She reports that she feels worse when she is at work and reports that her BP is rising at work to the 150's/90's.  When she was seen last week she reports not feeling well but, the HA is new since that time.    Contractions: Irritability. Vag. Bleeding: None.  Movement: Present. Denies leaking of fluid.   The following portions of the patient's history were reviewed and updated as appropriate: allergies, current medications, past family history, past medical history, past social history, past surgical history and problem list. Problem list updated.  Objective:   Filed Vitals:   11/07/15 0836  BP: 157/87  Pulse: 99  Weight: 260 lb (117.935 kg)    Fetal Status: Fetal Heart Rate (bpm): 140   Movement: Present     General:  Alert, oriented and cooperative. Patient is in no acute distress.  Skin: Skin is warm and dry. No rash noted.   Cardiovascular: Normal heart rate noted  Respiratory: Normal respiratory effort, no problems with respiration noted  Abdomen: Soft, gravid, appropriate for gestational age. Pain/Pressure: Present     Pelvic: Vag. Bleeding: None Vag D/C Character: Thin   Cervical exam deferred        Extremities: Normal range of motion.  Edema: Mild pitting, slight indentation  Mental Status: Normal mood and  affect. Normal behavior. Normal judgment and thought content.   Urinalysis: Urine Protein: Trace Urine Glucose: Trace  Assessment and Plan:  Pregnancy: [name] at [redacted]w[redacted]d  1. Gestational hypertension, antepartum BP's elevated today and associated with a HA.  Pt is at risk for preeclampsia given her h/o DM.  Will send to the [facility] at [facility] for serial BP's and labs.  Discussed with Dr. Adrian Blackwater.  If pt able to go home will take out of work.  2. High risk multigravida, antepartum  3. History of cesarean delivery, antepartum Pt declines TOL.  Requests repeat c-section at the time of delivery.  4. Maternal pregestational diabetes classes B through R, antepartum (HCC) Pt getting biweekly NST's with weekly AFI's NST reviewed and reactive today  5. MTHFR deficiency complicating pregnancy, third trimester (HCC)  6. Recurrent pregnancy loss, antepartum condition or complication  7. Antepartum multigravida of advanced maternal age, third trimester  Preterm labor symptoms and general obstetric precautions including but not limited to vaginal bleeding, contractions, leaking of fluid and fetal movement were reviewed in detail with the patient. Please refer to After Visit Summary for other counseling recommendations.  Return in about 4 days (around 11/11/2015).   Willodean Rosenthal, MD

## 2015-11-07 NOTE — MAU Provider Note (Signed)
History     CSN: [id number] Arrival date and time: 11/07/15 1011 First Provider Initiated Contact with Patient 11/07/15 1115    Chief Complaint  Patient presents with  . Hypertension  . Headache   HPI Patient is 35 y.o. [id number] 32w5dhere with complaints of elevated blood pressure with HA. 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  Reports HA for 1 week , no improving with tylenol. Reports seeing spots last week but not today. Denies RUQ pain, edema.   +FM, denies LOF, VB, contractions, vaginal discharge.  OB History    Gravida Para Term Preterm AB TAB SAB Ectopic Multiple Living   11 1 1  9 1 8   1       Past Medical History  Diagnosis Date  . [place] complication [place]   . Vaginal Pap smear, abnormal   . [place]   . PONV (postoperative nausea and vomiting)   . Headache   . Asthma     Past Surgical History  Procedure Laterality Date  . Tonsillectomy  1987  . Cesarean section  03-2013  . Cryotherapy    . Dilation and curettage of uterus  09-2007  . Dilation and curettage of uterus  11-2011  . Cholecystectomy  1997    Family History  Problem Relation Age of Onset  . Cancer Father   . Hypertension Father   . Heart disease Father   . Diabetes Father   . Stroke Brother   . Diabetes Brother   . Stroke Maternal Grandmother     Social History  Substance Use Topics  . Smoking status: Never Smoker   . Smokeless tobacco: None  . Alcohol Use: No    Allergies:  Allergies  Allergen Reactions  . Prednisone Rash  . Codeine Nausea Only  . Sulfa Antibiotics Rash    Prescriptions prior to admission  Medication Sig Dispense Refill Last Dose  . acetaminophen (TYLENOL) 325 MG tablet Take 650 mg by mouth every 6 (six) hours as needed for headache.   11/07/2015 at Unknown time  . albuterol (PROVENTIL HFA;VENTOLIN HFA) 108 (90 BASE) MCG/ACT inhaler Inhale 1 puff into the  lungs every 6 (six) hours as needed for wheezing or shortness of breath.   rescue  . albuterol (PROVENTIL) (2.5 MG/3ML) 0.083% nebulizer solution Take 2.5 mg by nebulization every 6 (six) hours as needed for wheezing.    rescue  . aspirin 81 MG EC tablet Take 81 mg by mouth daily.    11/06/2015 at Unknown time  . Docosahexaenoic Acid (PRENATAL DHA PO) Take 6 each by mouth daily. Gummies     . glyBURIDE (DIABETA) 5 MG tablet Take 0.5 tablets (2.5 mg total) by mouth at bedtime. 45 tablet 3 11/06/2015 at Unknown time  . metFORMIN (GLUCOPHAGE) 1000 MG tablet Take 1 tablet (1,000 mg total) by mouth daily. (Patient taking differently: Take 1,000 mg by mouth at bedtime. ) 90 tablet 3 11/06/2015 at Unknown time  . Blood Glucose Monitoring Suppl KIT Use device to check blood sugars 1-2 times daily. Please provide what is covered by insurance   Taking  . fluticasone (FLONASE) 50 MCG/ACT nasal spray Place 2 sprays into both nostrils daily. (Patient taking differently: Place 2 sprays into both nostrils daily as needed for allergies. ) 16 g 3 prn  . glucose blood test strip Use device to check blood sugars 1-2 times daily. Please provide what is covered by  insurance   Taking  . Lancets 28G MISC Use device to check blood sugars 1-2 times daily. Please provide what is covered by insurance   Taking  . promethazine (PHENERGAN) 25 MG suppository Place 1 suppository (25 mg total) rectally every 6 (six) hours as needed for nausea or vomiting. (Patient not taking: Reported on 11/07/2015) 12 each 1 Not Taking at Unknown time    Review of Systems  Constitutional: Negative for fever and chills.  Eyes: Negative for blurred vision and double vision.  Respiratory: Negative for cough and shortness of breath.   Cardiovascular: Negative for chest pain and orthopnea.  Gastrointestinal: Negative for nausea and vomiting.  Genitourinary: Negative for dysuria, frequency and flank pain.  Musculoskeletal: Negative for myalgias.  Skin:  Negative for rash.  Neurological: Negative for dizziness, tingling, weakness and headaches.  Endo/Heme/Allergies: Does not bruise/bleed easily.  Psychiatric/Behavioral: Negative for depression and suicidal ideas. The patient is not nervous/anxious.    Physical Exam   Blood pressure 134/78, pulse 94, temperature 98.7 F (37.1 C), temperature source Oral, resp. rate 18, last menstrual period 02/06/2015, SpO2 98 %.  Physical Exam  Nursing note and vitals reviewed. Constitutional: She is oriented to person, place, and time. She appears well-developed and well-nourished. No distress.  Pregnant female  HENT:  Head: Normocephalic and atraumatic.  Eyes: Conjunctivae are normal. No scleral icterus.  Neck: Normal range of motion. Neck supple.  Cardiovascular: Normal rate and intact distal pulses.   Respiratory: Effort normal. She exhibits no tenderness.  GI: Soft. There is no tenderness. There is no rebound and no guarding.  Gravid  Genitourinary: Vagina normal.  Musculoskeletal: Normal range of motion. She exhibits no edema.  Neurological: She is alert and oriented to person, place, and time. She displays normal reflexes. No cranial nerve deficit. Coordination normal.  No clonus  Skin: Skin is warm and dry. No rash noted.  Psychiatric: She has a normal mood and affect.    MAU Course  Procedures  MDM CMP- wnl CBC- wnl UPC= 0.13  Tylenol- ineffective Fioricet- received at 12:42, assessed at 1:15 and improved.   Assessment and Plan  Margaret Cabrera is 35 y.o. [id number] at 66w5dpresenting with elevated BP in the outpatient setting.   #gHTN: Patient meets criteria for gestational HTN. No criteria for preeclampsia currently. She is at risk for thisgiven her  history of T2DM and preeclampsia in prior pregnancy. Work up negative today for preeclampsia here. HA improved with fioricet. BP improved throughout stay without medication intervention and no severe range BP -discharge home with  strict return precautions -recommend weekly HELLP labs -Rx for flexeril and fioricet provided -repeat c-section at 37-39 weeks  KCaren Macadam1/30/2017, 11:15 AM

## 2015-11-07 NOTE — MAU Note (Signed)
Sent from [facility] office for elevated blood pressure, has had headache x1 week.  Feels pelvic pressure, states "I feel miserable".  Reports increased hand and foot swelling since Friday, nausea, and has started seeing spots.  Denies contractions, bleeding, and LOF.

## 2015-11-07 NOTE — Patient Instructions (Signed)
Nonstress Test  The nonstress test is a procedure that monitors the fetus's heartbeat. The test will monitor the heartbeat when the fetus is at rest and while the fetus is moving. In a healthy fetus, there will be an increase in fetal heart rate when the fetus moves or kicks. The heart rate will decrease at rest. This test helps determine if the fetus is healthy. Your health care provider will look at a number of patterns in the heart rate tracing to make sure your baby is thriving. If there is concern, your health care provider may order additional tests or may suggest another course of action. This test is often done in the third trimester and can help determine if an early delivery is needed and safe. Common reasons to have this test are:  · You are past your due date.  · You have a high-risk pregnancy.  · You are feeling less movement than normal.  · You have lost a pregnancy in the past.  · Your health care provider suspects fetal growth problems.  · You have too much or too little amniotic fluid.  BEFORE THE PROCEDURE  · Eat a meal right before the test or as directed by your health care provider. Food may help stimulate fetal movements.  · Use the restroom right before the test.  PROCEDURE  · Two belts will be placed around your abdomen. These belts have monitors attached to them. One records the fetal heart rate and the other records uterine contractions.  · You may be asked to lie down on your side or to stay sitting upright.  · You may be given a button to press when you feel movement.  · The fetal heartbeat is listened to and watched on a screen. The heartbeat is recorded on a sheet of paper.  · If the fetus seems to be sleeping, you may be asked to drink some juice or soda, gently press your abdomen, or make some noise to wake the fetus.  AFTER THE PROCEDURE   Your health care provider will discuss the test results with you and make recommendations for the near future.     This information is not  intended to replace advice given to you by your health care provider. Make sure you discuss any questions you have with your health care provider.     Document Released: 09/14/2002 Document Revised: 10/15/2014 Document Reviewed: 10/28/2012  Elsevier Interactive Patient Education ©2016 [employer]

## 2015-11-07 NOTE — MAU Note (Signed)
Urine in lab 

## 2015-11-07 NOTE — Progress Notes (Signed)
Patient complaining of increased blood pressures at work but they resolve once she is at home and can rest. Armandina Stammer RN BSN

## 2015-11-10 ENCOUNTER — Ambulatory Visit (INDEPENDENT_AMBULATORY_CARE_PROVIDER_SITE_OTHER): Payer: 59

## 2015-11-10 VITALS — BP 124/70 | HR 91

## 2015-11-10 DIAGNOSIS — O133 Gestational [pregnancy-induced] hypertension without significant proteinuria, third trimester: Secondary | ICD-10-CM

## 2015-11-10 DIAGNOSIS — E7212 Methylenetetrahydrofolate reductase deficiency: Secondary | ICD-10-CM

## 2015-11-10 DIAGNOSIS — O24313 Unspecified pre-existing diabetes mellitus in pregnancy, third trimester: Secondary | ICD-10-CM | POA: Diagnosis not present

## 2015-11-10 DIAGNOSIS — O99283 Endocrine, nutritional and metabolic diseases complicating pregnancy, third trimester: Principal | ICD-10-CM

## 2015-11-10 NOTE — Progress Notes (Signed)
Dr. Adrian Blackwater at bedside reviewing NST and speaking with patient. Armandina Stammer RN BSN 11-10-15 @ 953 AM

## 2015-11-14 ENCOUNTER — Ambulatory Visit (INDEPENDENT_AMBULATORY_CARE_PROVIDER_SITE_OTHER): Payer: 59 | Admitting: Obstetrics & Gynecology

## 2015-11-14 VITALS — BP 124/65 | HR 98 | Wt 259.0 lb

## 2015-11-14 DIAGNOSIS — O09899 Supervision of other high risk pregnancies, unspecified trimester: Secondary | ICD-10-CM

## 2015-11-14 DIAGNOSIS — E7212 Methylenetetrahydrofolate reductase deficiency: Secondary | ICD-10-CM

## 2015-11-14 DIAGNOSIS — O09523 Supervision of elderly multigravida, third trimester: Secondary | ICD-10-CM | POA: Diagnosis not present

## 2015-11-14 DIAGNOSIS — O2620 Pregnancy care for patient with recurrent pregnancy loss, unspecified trimester: Secondary | ICD-10-CM

## 2015-11-14 DIAGNOSIS — O99283 Endocrine, nutritional and metabolic diseases complicating pregnancy, third trimester: Secondary | ICD-10-CM

## 2015-11-14 DIAGNOSIS — O34219 Maternal care for unspecified type scar from previous cesarean delivery: Secondary | ICD-10-CM

## 2015-11-16 NOTE — Progress Notes (Signed)
Patient ID: Lizzette Carbonell, female   DOB: 03-04-1981, 35 y.o.   MRN: [id number] Subjective:  Lorece Keach is a 35 y.o. [id number] at [redacted]w[redacted]d being seen today for ongoing prenatal care.  She is currently monitored for the following issues for this high-risk pregnancy and has Antepartum multigravida of advanced maternal age; High risk multigravida, antepartum; Recurrent pregnancy loss, antepartum condition or complication; MTHFR deficiency complicating pregnancy (HCC); Maternal pregestational diabetes classes B through R, antepartum (HCC); History of cesarean delivery, antepartum; and Gestational hypertension, antepartum on her problem list.  Patient reports no complaints. HA improved now that she is not working. Contractions: Irregular. Vag. Bleeding: None.  Movement: Present. Denies leaking of fluid.   The following portions of the patient's history were reviewed and updated as appropriate: allergies, current medications, past family history, past medical history, past social history, past surgical history and problem list. Problem list updated.  Objective:   Filed Vitals:   11/14/15 0830  BP: 124/65  Pulse: 98  Weight: 259 lb (117.482 kg)   NST reviewed and reactive. AFI 13.2 performed by me      Fetal Status: Fetal Heart Rate (bpm): NST   Movement: Present     General:  Alert, oriented and cooperative. Patient is in no acute distress.  Skin: Skin is warm and dry. No rash noted.   Cardiovascular: Normal heart rate noted  Respiratory: Normal respiratory effort, no problems with respiration noted  Abdomen: Soft, gravid, appropriate for gestational age. Pain/Pressure: Present     Pelvic: Vag. Bleeding: None Vag D/C Character: Thin   Cervical exam deferred        Extremities: Normal range of motion.  Edema: Trace  Mental Status: Normal mood and affect. Normal behavior. Normal judgment and thought content.   Urinalysis: Urine Protein: Trace Urine Glucose: Trace  Assessment and Plan:   Pregnancy: [id number] at [redacted]w[redacted]d  1. High risk multigravida, antepartum  2. Antepartum multigravida of advanced maternal age, third trimester  3. Recurrent pregnancy loss, antepartum condition or complication  4. MTHFR deficiency complicating pregnancy, third trimester (HCC)  5. History of cesarean delivery, antepartum Repeat scheduled at [redacted] weeks gestation  Preterm labor symptoms and general obstetric precautions including but not limited to vaginal bleeding, contractions, leaking of fluid and fetal movement were reviewed in detail with the patient. Please refer to After Visit Summary for other counseling recommendations.  Return in about 1 week (around 11/21/2015).   Willodean Rosenthal, MD

## 2015-11-17 ENCOUNTER — Ambulatory Visit (INDEPENDENT_AMBULATORY_CARE_PROVIDER_SITE_OTHER): Payer: 59 | Admitting: *Deleted

## 2015-11-17 ENCOUNTER — Encounter: Payer: Self-pay | Admitting: Obstetrics & Gynecology

## 2015-11-17 VITALS — BP 138/81 | HR 105 | Wt 261.0 lb

## 2015-11-17 DIAGNOSIS — O133 Gestational [pregnancy-induced] hypertension without significant proteinuria, third trimester: Secondary | ICD-10-CM

## 2015-11-17 DIAGNOSIS — O09899 Supervision of other high risk pregnancies, unspecified trimester: Secondary | ICD-10-CM

## 2015-11-18 ENCOUNTER — Other Ambulatory Visit (HOSPITAL_COMMUNITY): Payer: Self-pay | Admitting: Maternal and Fetal Medicine

## 2015-11-18 ENCOUNTER — Ambulatory Visit (HOSPITAL_COMMUNITY)
Admission: RE | Admit: 2015-11-18 | Discharge: 2015-11-18 | Disposition: A | Discharge status: Home / Self Care | Payer: 59 | Source: Ambulatory Visit | Attending: Obstetrics & Gynecology | Admitting: Obstetrics & Gynecology

## 2015-11-18 DIAGNOSIS — O24313 Unspecified pre-existing diabetes mellitus in pregnancy, third trimester: Secondary | ICD-10-CM

## 2015-11-18 DIAGNOSIS — O99283 Endocrine, nutritional and metabolic diseases complicating pregnancy, third trimester: Secondary | ICD-10-CM

## 2015-11-18 DIAGNOSIS — E7212 Methylenetetrahydrofolate reductase deficiency: Secondary | ICD-10-CM

## 2015-11-18 DIAGNOSIS — Z3A35 35 weeks gestation of pregnancy: Secondary | ICD-10-CM

## 2015-11-18 DIAGNOSIS — O99213 Obesity complicating pregnancy, third trimester: Secondary | ICD-10-CM

## 2015-11-18 DIAGNOSIS — O2623 Pregnancy care for patient with recurrent pregnancy loss, third trimester: Secondary | ICD-10-CM

## 2015-11-18 DIAGNOSIS — O09523 Supervision of elderly multigravida, third trimester: Secondary | ICD-10-CM

## 2015-11-18 DIAGNOSIS — O24913 Unspecified diabetes mellitus in pregnancy, third trimester: Secondary | ICD-10-CM

## 2015-11-18 DIAGNOSIS — O34211 Maternal care for low transverse scar from previous cesarean delivery: Secondary | ICD-10-CM

## 2015-11-21 ENCOUNTER — Other Ambulatory Visit: Payer: Managed Care, Other (non HMO)

## 2015-11-21 ENCOUNTER — Ambulatory Visit (INDEPENDENT_AMBULATORY_CARE_PROVIDER_SITE_OTHER): Payer: 59 | Admitting: Obstetrics & Gynecology

## 2015-11-21 VITALS — BP 128/63 | HR 104 | Wt 265.0 lb

## 2015-11-21 DIAGNOSIS — O34219 Maternal care for unspecified type scar from previous cesarean delivery: Secondary | ICD-10-CM

## 2015-11-21 DIAGNOSIS — Z113 Encounter for screening for infections with a predominantly sexual mode of transmission: Secondary | ICD-10-CM

## 2015-11-21 DIAGNOSIS — O99283 Endocrine, nutritional and metabolic diseases complicating pregnancy, third trimester: Secondary | ICD-10-CM

## 2015-11-21 DIAGNOSIS — E7212 Methylenetetrahydrofolate reductase deficiency: Secondary | ICD-10-CM

## 2015-11-21 DIAGNOSIS — O09899 Supervision of other high risk pregnancies, unspecified trimester: Secondary | ICD-10-CM

## 2015-11-21 DIAGNOSIS — O139 Gestational [pregnancy-induced] hypertension without significant proteinuria, unspecified trimester: Secondary | ICD-10-CM

## 2015-11-21 DIAGNOSIS — O24319 Unspecified pre-existing diabetes mellitus in pregnancy, unspecified trimester: Secondary | ICD-10-CM

## 2015-11-21 DIAGNOSIS — O09523 Supervision of elderly multigravida, third trimester: Secondary | ICD-10-CM

## 2015-11-21 DIAGNOSIS — E119 Type 2 diabetes mellitus without complications: Secondary | ICD-10-CM

## 2015-11-21 NOTE — Progress Notes (Signed)
Subjective:  Margaret Cabrera is a 35 y.o. [id number] at [redacted]w[redacted]d being seen today for ongoing prenatal care.  She is currently monitored for the following issues for this high-risk pregnancy and has Antepartum multigravida of advanced maternal age; High risk multigravida, antepartum; Recurrent pregnancy loss, antepartum condition or complication; MTHFR deficiency complicating pregnancy (HCC); Maternal pregestational diabetes classes B through R, antepartum (HCC); History of cesarean delivery, antepartum; and Gestational hypertension, antepartum on her problem list.  Patient reports no complaints.  Contractions: Irregular. Vag. Bleeding: None.  Movement: Present. Denies leaking of fluid. She forgot to bring her sugars, says that her fbs are ranging from 77-108. She doesn't remember to check her sugars exactly 2 hours pp, but says that she only rarely has one above 120.  The following portions of the patient's history were reviewed and updated as appropriate: allergies, current medications, past family history, past medical history, past social history, past surgical history and problem list. Problem list updated.  Objective:   Filed Vitals:   11/21/15 0833  BP: 128/63  Pulse: 104  Weight: 265 lb (120.203 kg)    Fetal Status: Fetal Heart Rate (bpm): nst   Movement: Present     General:  Alert, oriented and cooperative. Patient is in no acute distress.  Skin: Skin is warm and dry. No rash noted.   Cardiovascular: Normal heart rate noted  Respiratory: Normal respiratory effort, no problems with respiration noted  Abdomen: Soft, gravid, appropriate for gestational age. Pain/Pressure: Present     Pelvic: Vag. Bleeding: None Vag D/C Character: Thin   Cervical exam deferred        Extremities: Normal range of motion.  Edema: Trace  Mental Status: Normal mood and affect. Normal behavior. Normal judgment and thought content.   Urinalysis:      Assessment and Plan:  Pregnancy: [name] at [redacted]w[redacted]d  1.  Gestational hypertension, antepartum   2. History of cesarean delivery, antepartum - RLTCS and BTL scheduled at 39 weeks   3. High risk multigravida, antepartum  - GC/Chlamydia probe amp ([place])not at [facility] - Culture, beta strep (group b only)  4. Antepartum multigravida of advanced maternal age, third trimester - She will continue twice weekly NST/AFI  5. Maternal pregestational diabetes classes B through R, antepartum (HCC)   6. MTHFR deficiency complicating pregnancy, third trimester (HCC)   Preterm labor symptoms and general obstetric precautions including but not limited to vaginal bleeding, contractions, leaking of fluid and fetal movement were reviewed in detail with the patient. Please refer to After Visit Summary for other counseling recommendations.  No Follow-up on file.   Allie Bossier, MD

## 2015-11-21 NOTE — Progress Notes (Signed)
Patient NST reactive and AFI done 14.4 cm.

## 2015-11-22 LAB — GC/CHLAMYDIA PROBE AMP (~~LOC~~) NOT AT ARMC
Chlamydia: NEGATIVE
NEISSERIA GONORRHEA: NEGATIVE

## 2015-11-23 ENCOUNTER — Encounter (HOSPITAL_COMMUNITY): Payer: Self-pay

## 2015-11-23 ENCOUNTER — Inpatient Hospital Stay (HOSPITAL_COMMUNITY)
Admission: AD | Admit: 2015-11-23 | Discharge: 2015-11-25 | DRG: 765 | Disposition: A | Discharge status: Home / Self Care | Payer: Managed Care, Other (non HMO) | Source: Ambulatory Visit | Attending: Obstetrics & Gynecology | Admitting: Obstetrics & Gynecology

## 2015-11-23 DIAGNOSIS — O9952 Diseases of the respiratory system complicating childbirth: Secondary | ICD-10-CM | POA: Diagnosis present

## 2015-11-23 DIAGNOSIS — Z823 Family history of stroke: Secondary | ICD-10-CM

## 2015-11-23 DIAGNOSIS — O99284 Endocrine, nutritional and metabolic diseases complicating childbirth: Secondary | ICD-10-CM | POA: Diagnosis present

## 2015-11-23 DIAGNOSIS — O1490 Unspecified pre-eclampsia, unspecified trimester: Secondary | ICD-10-CM | POA: Diagnosis present

## 2015-11-23 DIAGNOSIS — Z7984 Long term (current) use of oral hypoglycemic drugs: Secondary | ICD-10-CM | POA: Diagnosis not present

## 2015-11-23 DIAGNOSIS — Z833 Family history of diabetes mellitus: Secondary | ICD-10-CM

## 2015-11-23 DIAGNOSIS — O2620 Pregnancy care for patient with recurrent pregnancy loss, unspecified trimester: Secondary | ICD-10-CM

## 2015-11-23 DIAGNOSIS — E119 Type 2 diabetes mellitus without complications: Secondary | ICD-10-CM | POA: Diagnosis present

## 2015-11-23 DIAGNOSIS — O34219 Maternal care for unspecified type scar from previous cesarean delivery: Secondary | ICD-10-CM

## 2015-11-23 DIAGNOSIS — Z8249 Family history of ischemic heart disease and other diseases of the circulatory system: Secondary | ICD-10-CM

## 2015-11-23 DIAGNOSIS — O09899 Supervision of other high risk pregnancies, unspecified trimester: Secondary | ICD-10-CM

## 2015-11-23 DIAGNOSIS — O3663X0 Maternal care for excessive fetal growth, third trimester, not applicable or unspecified: Secondary | ICD-10-CM | POA: Diagnosis present

## 2015-11-23 DIAGNOSIS — J45909 Unspecified asthma, uncomplicated: Secondary | ICD-10-CM | POA: Diagnosis present

## 2015-11-23 DIAGNOSIS — Z302 Encounter for sterilization: Secondary | ICD-10-CM | POA: Diagnosis not present

## 2015-11-23 DIAGNOSIS — O99513 Diseases of the respiratory system complicating pregnancy, third trimester: Secondary | ICD-10-CM | POA: Diagnosis not present

## 2015-11-23 DIAGNOSIS — O133 Gestational [pregnancy-induced] hypertension without significant proteinuria, third trimester: Secondary | ICD-10-CM | POA: Diagnosis present

## 2015-11-23 DIAGNOSIS — O1493 Unspecified pre-eclampsia, third trimester: Secondary | ICD-10-CM | POA: Diagnosis not present

## 2015-11-23 DIAGNOSIS — O2623 Pregnancy care for patient with recurrent pregnancy loss, third trimester: Secondary | ICD-10-CM | POA: Diagnosis present

## 2015-11-23 DIAGNOSIS — O34211 Maternal care for low transverse scar from previous cesarean delivery: Secondary | ICD-10-CM | POA: Diagnosis present

## 2015-11-23 DIAGNOSIS — O139 Gestational [pregnancy-induced] hypertension without significant proteinuria, unspecified trimester: Secondary | ICD-10-CM

## 2015-11-23 DIAGNOSIS — O1414 Severe pre-eclampsia complicating childbirth: Principal | ICD-10-CM | POA: Diagnosis present

## 2015-11-23 DIAGNOSIS — O24113 Pre-existing diabetes mellitus, type 2, in pregnancy, third trimester: Secondary | ICD-10-CM | POA: Diagnosis not present

## 2015-11-23 DIAGNOSIS — O99283 Endocrine, nutritional and metabolic diseases complicating pregnancy, third trimester: Secondary | ICD-10-CM

## 2015-11-23 DIAGNOSIS — O24319 Unspecified pre-existing diabetes mellitus in pregnancy, unspecified trimester: Secondary | ICD-10-CM

## 2015-11-23 DIAGNOSIS — Z3A36 36 weeks gestation of pregnancy: Secondary | ICD-10-CM | POA: Diagnosis not present

## 2015-11-23 DIAGNOSIS — O2412 Pre-existing diabetes mellitus, type 2, in childbirth: Secondary | ICD-10-CM | POA: Diagnosis present

## 2015-11-23 DIAGNOSIS — E7212 Methylenetetrahydrofolate reductase deficiency: Secondary | ICD-10-CM | POA: Diagnosis present

## 2015-11-23 DIAGNOSIS — O1403 Mild to moderate pre-eclampsia, third trimester: Secondary | ICD-10-CM | POA: Diagnosis not present

## 2015-11-23 LAB — PROTEIN / CREATININE RATIO, URINE
CREATININE, URINE: 69 mg/dL
PROTEIN CREATININE RATIO: 0.13 mg/mg{creat} (ref 0.00–0.15)
Total Protein, Urine: 9 mg/dL

## 2015-11-23 LAB — URINALYSIS, ROUTINE W REFLEX MICROSCOPIC
Bilirubin Urine: NEGATIVE
GLUCOSE, UA: NEGATIVE mg/dL
HGB URINE DIPSTICK: NEGATIVE
KETONES UR: NEGATIVE mg/dL
Leukocytes, UA: NEGATIVE
Nitrite: NEGATIVE
PROTEIN: NEGATIVE mg/dL
Specific Gravity, Urine: 1.025 (ref 1.005–1.030)
pH: 5.5 (ref 5.0–8.0)

## 2015-11-23 LAB — CBC
HCT: 35.6 % — ABNORMAL LOW (ref 36.0–46.0)
Hemoglobin: 12 g/dL (ref 12.0–15.0)
MCH: 29.4 pg (ref 26.0–34.0)
MCHC: 33.7 g/dL (ref 30.0–36.0)
MCV: 87.3 fL (ref 78.0–100.0)
PLATELETS: 261 10*3/uL (ref 150–400)
RBC: 4.08 MIL/uL (ref 3.87–5.11)
RDW: 14.9 % (ref 11.5–15.5)
WBC: 9.7 10*3/uL (ref 4.0–10.5)

## 2015-11-23 LAB — COMPREHENSIVE METABOLIC PANEL
ALT: 11 U/L — AB (ref 14–54)
AST: 13 U/L — AB (ref 15–41)
Albumin: 2.9 g/dL — ABNORMAL LOW (ref 3.5–5.0)
Alkaline Phosphatase: 77 U/L (ref 38–126)
Anion gap: 7 (ref 5–15)
BUN: 12 mg/dL (ref 6–20)
CALCIUM: 9 mg/dL (ref 8.9–10.3)
CHLORIDE: 108 mmol/L (ref 101–111)
CO2: 21 mmol/L — AB (ref 22–32)
Creatinine, Ser: 0.56 mg/dL (ref 0.44–1.00)
GLUCOSE: 89 mg/dL (ref 65–99)
POTASSIUM: 4 mmol/L (ref 3.5–5.1)
SODIUM: 136 mmol/L (ref 135–145)
TOTAL PROTEIN: 7.2 g/dL (ref 6.5–8.1)
Total Bilirubin: 0.2 mg/dL — ABNORMAL LOW (ref 0.3–1.2)

## 2015-11-23 LAB — TYPE AND SCREEN
ABO/RH(D): O POS
Antibody Screen: NEGATIVE

## 2015-11-23 MED ORDER — BETAMETHASONE SOD PHOS & ACET 6 (3-3) MG/ML IJ SUSP
12.0000 mg | INTRAMUSCULAR | Status: AC
  Administered 2015-11-23 – 2015-11-24 (×2): 12 mg via INTRAMUSCULAR
  Filled 2015-11-23 (×2): qty 2

## 2015-11-23 MED ORDER — LABETALOL HCL 5 MG/ML IV SOLN
20.0000 mg | INTRAVENOUS | Status: DC | PRN
  Administered 2015-11-23: 20 mg via INTRAVENOUS

## 2015-11-23 MED ORDER — LACTATED RINGERS IV SOLN
INTRAVENOUS | Status: DC
  Administered 2015-11-23: 125 mL/h via INTRAVENOUS

## 2015-11-23 MED ORDER — PRENATAL MULTIVITAMIN CH
1.0000 | ORAL_TABLET | Freq: Every day | ORAL | Status: DC
  Filled 2015-11-23 (×2): qty 1

## 2015-11-23 MED ORDER — DOCUSATE SODIUM 100 MG PO CAPS
100.0000 mg | ORAL_CAPSULE | Freq: Every day | ORAL | Status: DC
  Filled 2015-11-23 (×2): qty 1

## 2015-11-23 MED ORDER — HYDRALAZINE HCL 20 MG/ML IJ SOLN: 10.0000 mg | Freq: Once | INTRAMUSCULAR | Status: DC | PRN

## 2015-11-23 MED ORDER — LABETALOL HCL 5 MG/ML IV SOLN
INTRAVENOUS | Status: AC
  Administered 2015-11-23: 20 mg via INTRAVENOUS
  Filled 2015-11-23: qty 4

## 2015-11-23 MED ORDER — BUTORPHANOL TARTRATE 1 MG/ML IJ SOLN
2.0000 mg | Freq: Once | INTRAMUSCULAR | Status: AC
Start: 2015-11-23 — End: 2015-11-23
  Administered 2015-11-23: 2 mg via INTRAVENOUS
  Filled 2015-11-23: qty 2

## 2015-11-23 MED ORDER — ACETAMINOPHEN 325 MG PO TABS
650.0000 mg | ORAL_TABLET | ORAL | Status: DC | PRN
  Administered 2015-11-24: 650 mg via ORAL
  Filled 2015-11-23: qty 2

## 2015-11-23 MED ORDER — CALCIUM CARBONATE ANTACID 500 MG PO CHEW
2.0000 | CHEWABLE_TABLET | ORAL | Status: DC | PRN
  Filled 2015-11-23: qty 2

## 2015-11-23 MED ORDER — ZOLPIDEM TARTRATE 5 MG PO TABS: 5.0000 mg | ORAL_TABLET | Freq: Every evening | ORAL | Status: DC | PRN

## 2015-11-23 NOTE — MAU Note (Signed)
Urine specimen sent to lab

## 2015-11-23 NOTE — MAU Provider Note (Signed)
See H& P from same date    History     CSN: [id number]  Arrival date and time: 11/23/15 4680   First Provider Initiated Contact with Patient 11/23/15 1953      Chief Complaint  Patient presents with  . Headache  . Abdominal Cramping   HPI  Patient is 35 y.o. [id number] 12w0dwith pregnancy complicated by gHTN, Class B GDM, h/o CS (planned repeat), here with complaints of HA and Abdominal cramping.  +FM, denies LOF, VB, contractions, vaginal discharge.   OB History    Gravida Para Term Preterm AB TAB SAB Ectopic Multiple Living   11 1 1  9 1 8   1       Past Medical History  Diagnosis Date  . Diabetes mellitus without complication ([place]   . Vaginal Pap smear, abnormal   . [place]THFR mutation ([place]   . PONV (postoperative nausea and vomiting)   . Headache   . Asthma     Past Surgical History  Procedure Laterality Date  . Tonsillectomy  1987  . Cesarean section  03-2013  . Cryotherapy    . Dilation and curettage of uterus  09-2007  . Dilation and curettage of uterus  11-2011  . Cholecystectomy  1997  . Urethra stretched      Family History  Problem Relation Age of Onset  . Cancer Father   . Hypertension Father   . Heart disease Father   . Diabetes Father   . Stroke Brother   . Diabetes Brother   . Stroke Maternal Grandmother     Social History  Substance Use Topics  . Smoking status: Never Smoker   . Smokeless tobacco: None  . Alcohol Use: No    Allergies:  Allergies  Allergen Reactions  . Prednisone Rash  . Codeine Nausea Only  . Sulfa Antibiotics Rash    Prescriptions prior to admission  Medication Sig Dispense Refill Last Dose  . acetaminophen (TYLENOL) 325 MG tablet Take 650 mg by mouth every 6 (six) hours as needed for headache.   11/23/2015 at 1300  . aspirin 81 MG EC tablet Take 81 mg by mouth daily.    11/22/2015 at Unknown time  . butalbital-acetaminophen-caffeine (FIORICET, ESGIC) 50-325-40 MG tablet TAKE 1 TO 2 CAPSULES EVERY 6 HOURS AS  NEEDED FOR HEADACHE  3 11/23/2015 at 1300  . Docosahexaenoic Acid (PRENATAL DHA PO) Take 6 each by mouth daily. Gummies   11/22/2015 at Unknown time  . glyBURIDE (DIABETA) 5 MG tablet Take 0.5 tablets (2.5 mg total) by mouth at bedtime. 45 tablet 3 11/22/2015 at Unknown time  . metFORMIN (GLUCOPHAGE) 1000 MG tablet Take 1 tablet (1,000 mg total) by mouth daily. (Patient taking differently: Take 1,000 mg by mouth at bedtime. ) 90 tablet 3 11/22/2015 at Unknown time  . albuterol (PROVENTIL HFA;VENTOLIN HFA) 108 (90 BASE) MCG/ACT inhaler Inhale 1 puff into the lungs every 6 (six) hours as needed for wheezing or shortness of breath.   rescue  . albuterol (PROVENTIL) (2.5 MG/3ML) 0.083% nebulizer solution Take 2.5 mg by nebulization every 6 (six) hours as needed for wheezing.    rescue  . Blood Glucose Monitoring Suppl KIT Use device to check blood sugars 1-2 times daily. Please provide what is covered by insurance   Taking  . cyclobenzaprine (FLEXERIL) 5 MG tablet Take 1 tablet (5 mg total) by mouth 3 (three) times daily as needed for muscle spasms. (Patient not taking: Reported on 11/23/2015) 30 tablet 0 Not Taking  at Unknown time  . fluticasone (FLONASE) 50 MCG/ACT nasal spray Place 2 sprays into both nostrils daily. (Patient not taking: Reported on 11/23/2015) 16 g 3 Not Taking at Unknown time  . glucose blood test strip Use device to check blood sugars 1-2 times daily. Please provide what is covered by insurance   Taking  . Lancets 28G MISC Use device to check blood sugars 1-2 times daily. Please provide what is covered by insurance   Taking    Review of Systems  Constitutional: Negative for fever and chills.  Eyes: Negative for blurred vision and double vision.  Respiratory: Negative for cough and shortness of breath.   Cardiovascular: Negative for chest pain and orthopnea.  Gastrointestinal: Negative for nausea and vomiting.  Genitourinary: Negative for dysuria, frequency and flank pain.   Musculoskeletal: Negative for myalgias.  Skin: Negative for rash.  Neurological: Positive for headaches. Negative for dizziness, tingling and weakness.  Endo/Heme/Allergies: Does not bruise/bleed easily.  Psychiatric/Behavioral: Negative for depression and suicidal ideas. The patient is not nervous/anxious.    Physical Exam   Blood pressure 131/79, pulse 88, temperature 98.7 F (37.1 C), temperature source Oral, resp. rate 20, last menstrual period 02/06/2015.  Physical Exam  Nursing note and vitals reviewed. Constitutional: She is oriented to person, place, and time. She appears well-developed and well-nourished. No distress.  Pregnant female  HENT:  Head: Normocephalic and atraumatic.  Eyes: Conjunctivae are normal. No scleral icterus.  Neck: Normal range of motion. Neck supple.  Cardiovascular: Normal rate and intact distal pulses.   Respiratory: Effort normal. She exhibits no tenderness.  GI: Soft. There is no tenderness. There is no rebound and no guarding.  Gravid  Genitourinary: Vagina normal.  Musculoskeletal: Normal range of motion. She exhibits no edema.  Neurological: She is alert and oriented to person, place, and time.  Skin: Skin is warm and dry. No rash noted.  Psychiatric: She has a normal mood and affect.    MAU Course  Procedures  MDM  NST 140/mod/+accels, no decels Toco: quiet  HELLP labs ordered  Informed about elevated BP (severe range) and placed Preexclampsia order set  20:30 Signed out care to [place] 11/23/2015, 9:48 PM

## 2015-11-23 NOTE — H&P (Signed)
Margaret Cabrera is a 35 y.o. female G11 P25 @ 36.0wks by 9wk U/S presenting for eval of H/A. She was dx w/ gHTN approx 3 weeks ago and has had a H/A most of the time since then, usually at a 1-3/10 on the pain scale and responds to Tylenol or Fioricet. Today the H/A has gotten progressively worse despite medications. No visual disturbances, RUQ pain, vag bldg or leaking, ctx. Her preg has been followed by the [facility] [place] [facility] office since 10wks and has been remarkable for 1) prev C/S w/ desire for repeat 2) gHTN 3) mTHFR 4) prev macrosomic infant (10+13) 5) B-DM (diet controlled) 6) desires BTL 7) AMA  Scan on 2/10: EFW >90% (7+3), AC >97%, AFI 16cm, vtx  History OB History    Gravida Para Term Preterm AB TAB SAB Ectopic Multiple Living   Past Medical History  Diagnosis Date  . Diabetes mellitus without complication (HCC)   . Vaginal Pap smear, abnormal   . MTHFR mutation (HCC)   . PONV (postoperative nausea and vomiting)   . Headache   . Asthma    Past Surgical History  Procedure Laterality Date  . Tonsillectomy  1987  . Cesarean section  03-2013  . Cryotherapy    . Dilation and curettage of uterus  09-2007  . Dilation and curettage of uterus  11-2011  . Cholecystectomy  1997  . Urethra stretched     Family History: family history includes Cancer in her father; Diabetes in her brother and father; Heart disease in her father; Hypertension in her father; Stroke in her brother and maternal grandmother. Social History:  reports that she has never smoked. She does not have any smokeless tobacco history on file. She reports that she does not drink alcohol or use illicit drugs.   Prenatal Transfer Tool  Maternal Diabetes: Yes:  Diabetes Type:  Pre-pregnancy, Insulin/Medication controlled Genetic Screening: Normal Maternal Ultrasounds/Referrals: Normal Fetal Ultrasounds or other Referrals:  Referred to Materal Fetal Medicine  Maternal Substance Abuse:   No Significant Maternal Medications:  Meds include: Other: metformin, glyburide Significant Maternal Lab Results:  None Other Comments:  None  ROS   Initial BPs: 140s/80-90s, then 160-170/100s, after Lab 20 120-130s/70s Blood pressure 131/79, pulse 88, temperature 98.7 F (37.1 C), temperature source Oral, resp. rate 20, last menstrual period 02/06/2015. Exam Physical Exam  Constitutional: She is oriented to person, place, and time. She appears well-developed.  HENT:  Head: Normocephalic.  Neck: Normal range of motion.  Cardiovascular: Normal rate.   Respiratory: Effort normal.  GI:  EFM 140s, +accels, no decels No ctx  Musculoskeletal: Normal range of motion.  Neurological: She is alert and oriented to person, place, and time.  Skin: Skin is warm and dry.  Psychiatric: She has a normal mood and affect. Her behavior is normal. Thought content normal.    CBC    Component Value Date/Time   WBC 9.7 11/23/2015 1938   RBC 4.08 11/23/2015 1938   HGB 12.0 11/23/2015 1938   HGB 13.6 05/23/2015   HCT 35.6* 11/23/2015 1938   HCT 40 05/23/2015   PLT 261 11/23/2015 1938   PLT 291 05/23/2015   MCV 87.3 11/23/2015 1938   MCH 29.4 11/23/2015 1938   MCHC 33.7 11/23/2015 1938   RDW 14.9 11/23/2015 1938   CMP     Component Value Date/Time   NA 136 11/23/2015 1938   K 4.0 11/23/2015  1938   CL 108 11/23/2015 1938   CO2 21* 11/23/2015 1938   GLUCOSE 89 11/23/2015 1938   BUN 12 11/23/2015 1938   CREATININE 0.56 11/23/2015 1938   CREATININE 0.57 10/31/2015 1157   CALCIUM 9.0 11/23/2015 1938   PROT 7.2 11/23/2015 1938   ALBUMIN 2.9* 11/23/2015 1938   AST 13* 11/23/2015 1938   ALT 11* 11/23/2015 1938   ALKPHOS 77 11/23/2015 1938   BILITOT 0.2* 11/23/2015 1938   GFRNONAA >60 11/23/2015 1938   GFRAA >60 11/23/2015 1938   Urine P/C ratio: 0.13  Prenatal labs: ABO, Rh: O/Positive/-- (08/15 0000) Antibody: Negative (08/15 0000) Rubella: Immune (08/15 0000) RPR: NON REAC (01/02  0950)  HBsAg: Negative (08/15 0000)  HIV: NONREACTIVE (01/02 0950)  GBS:     Assessment/Plan: IUP@ 36.0wks gHTN w/ rule out pre-e Class B DM Prev C/S w/ plans for repeat and BTL LGA  Admit for Antenatal care Give BMZ x 2 Collect 24 hr urine Begin Labetalol 200 TID Continue glyburide & metformin   SHAW, KIMBERLY CNM 11/23/2015, 9:53 PM

## 2015-11-23 NOTE — MAU Note (Signed)
Been having headaches, meds not working.  Has had some BP elevations, currently have been under control.  Some cramping in lower abd. Hx of decreased fetal movement, baby has been unusually active today.  Just doesn't feel good.

## 2015-11-24 ENCOUNTER — Other Ambulatory Visit: Payer: Managed Care, Other (non HMO)

## 2015-11-24 DIAGNOSIS — O133 Gestational [pregnancy-induced] hypertension without significant proteinuria, third trimester: Secondary | ICD-10-CM

## 2015-11-24 LAB — CULTURE, BETA STREP (GROUP B ONLY)

## 2015-11-24 LAB — GLUCOSE, CAPILLARY
GLUCOSE-CAPILLARY: 110 mg/dL — AB (ref 65–99)
GLUCOSE-CAPILLARY: 120 mg/dL — AB (ref 65–99)
GLUCOSE-CAPILLARY: 95 mg/dL (ref 65–99)
Glucose-Capillary: 143 mg/dL — ABNORMAL HIGH (ref 65–99)
Glucose-Capillary: 120 mg/dL — ABNORMAL HIGH (ref 65–99)
Glucose-Capillary: 99 mg/dL (ref 65–99)

## 2015-11-24 LAB — ABO/RH: ABO/RH(D): O POS

## 2015-11-24 MED ORDER — CVS PRENATAL GUMMY 0.4-113.5 MG PO CHEW
1.0000 | CHEWABLE_TABLET | Freq: Every day | ORAL | Status: DC
  Administered 2015-11-24: 1 via ORAL

## 2015-11-24 MED ORDER — GLYBURIDE 2.5 MG PO TABS
2.5000 mg | ORAL_TABLET | Freq: Every day | ORAL | Status: DC
  Administered 2015-11-24 (×2): 2.5 mg via ORAL
  Filled 2015-11-24 (×2): qty 1

## 2015-11-24 MED ORDER — BUTALBITAL-APAP-CAFFEINE 50-325-40 MG PO TABS
2.0000 | ORAL_TABLET | Freq: Four times a day (QID) | ORAL | Status: DC | PRN
Start: 2015-11-24 — End: 2015-11-25
  Administered 2015-11-24: 2 via ORAL
  Filled 2015-11-24: qty 2

## 2015-11-24 MED ORDER — METFORMIN HCL 500 MG PO TABS
1000.0000 mg | ORAL_TABLET | Freq: Every day | ORAL | Status: DC
  Administered 2015-11-24 (×2): 1000 mg via ORAL
  Filled 2015-11-24 (×2): qty 2

## 2015-11-24 MED ORDER — LABETALOL HCL 200 MG PO TABS
200.0000 mg | ORAL_TABLET | Freq: Three times a day (TID) | ORAL | Status: DC
  Administered 2015-11-24 (×3): 200 mg via ORAL
  Filled 2015-11-24 (×4): qty 1

## 2015-11-24 MED ORDER — ASPIRIN 81 MG PO CHEW
81.0000 mg | CHEWABLE_TABLET | Freq: Every day | ORAL | Status: DC
  Administered 2015-11-24: 81 mg via ORAL
  Filled 2015-11-24 (×2): qty 1

## 2015-11-24 NOTE — Progress Notes (Signed)
FACULTY PRACTICE ANTEPARTUM(COMPREHENSIVE) NOTE  Margaret Cabrera is a 35 y.o. [id number] at [redacted]w[redacted]d by early ultrasound who is admitted for evaluation of preeclampsia.    Length of Stay:  1  Days  Subjective:   Patient reports the fetal movement as active. Patient reports uterine contraction  activity as none. Patient reports  vaginal bleeding as none. Patient describes fluid per vagina as None.  Vitals:  Blood pressure 139/61, pulse 107, temperature 98 F (36.7 C), temperature source Oral, resp. rate 18, height  (1.651 m), weight 120.203 kg (265 lb), last menstrual period 02/06/2015, SpO2 98 %. Physical Examination:  General appearance - alert, well appearing, and in no distress Heart - normal rate and regular rhythm Abdomen - soft, nontender, nondistended Fundal Height:  size equals dates Cervical Exam: Not evaluated.  Extremities: extremities normal, atraumatic, no cyanosis or edema and Homans sign is negative, no sign of DVT Membranes:intact   Fetal Monitoring:  Fetal Heart Rate A      Mode  External filed at 11/24/2015 1840    Baseline Rate (A)  145 bpm [removed] filed at 11/24/2015 1840    Variability  6-25 BPM filed at 11/24/2015 1840    Accelerations  15 x 15 filed at 11/24/2015 1840    Decelerations  Variable filed at 11/24/2015 1840      Labs:  Results for orders placed or performed during the hospital encounter of 11/23/15 (from the past 24 hour(s))  Glucose, capillary   Collection Time: 11/24/15 12:24 AM  Result Value Ref Range   Glucose-Capillary 120 (H) 65 - 99 mg/dL  Glucose, capillary   Collection Time: 11/24/15  4:57 AM  Result Value Ref Range   Glucose-Capillary 143 (H) 65 - 99 mg/dL  Glucose, capillary   Collection Time: 11/24/15  8:50 AM  Result Value Ref Range   Glucose-Capillary 120 (H) 65 - 99 mg/dL  Glucose, capillary   Collection Time: 11/24/15 12:36 PM  Result Value Ref Range   Glucose-Capillary 99 65 - 99 mg/dL  Glucose, capillary    Collection Time: 11/24/15  4:23 PM  Result Value Ref Range   Glucose-Capillary 95 65 - 99 mg/dL      Medications:  Scheduled . aspirin  81 mg Oral Daily  . betamethasone acetate-betamethasone sodium phosphate  12 mg Intramuscular Q24H  . [facility] PRENATAL GUMMY  1 Package Oral QHS  . docusate sodium  100 mg Oral Daily  . glyBURIDE  2.5 mg Oral QHS  . labetalol  200 mg Oral TID  . metFORMIN  1,000 mg Oral QHS   I have reviewed the patient's current medications.  ASSESSMENT: Patient Active Problem List   Diagnosis Date Noted  . Gestational hypertension w/o significant proteinuria in 3rd trimester 11/23/2015  . Gestational hypertension, antepartum 10/31/2015  . History of cesarean delivery, antepartum 06/27/2015  . Antepartum multigravida of advanced maternal age 11/29/2014  . High risk multigravida, antepartum 05/30/2015  . Recurrent pregnancy loss, antepartum condition or complication 05/30/2015  . MTHFR deficiency complicating pregnancy (HCC) 05/30/2015  . Maternal pregestational diabetes classes B through R, antepartum (HCC) 05/30/2015    PLAN: 24 hr urine pending. Will review all results in AM, will continue present management  ARNOLD,JAMES 11/24/2015,10:00 PM

## 2015-11-25 ENCOUNTER — Inpatient Hospital Stay (HOSPITAL_COMMUNITY)
Admission: AD | Admit: 2015-11-25 | Discharge: 2015-11-26 | Disposition: A | Discharge status: Home / Self Care | Payer: Managed Care, Other (non HMO) | Source: Ambulatory Visit | Attending: Obstetrics and Gynecology | Admitting: Obstetrics and Gynecology

## 2015-11-25 ENCOUNTER — Encounter (HOSPITAL_COMMUNITY): Payer: Self-pay | Admitting: *Deleted

## 2015-11-25 DIAGNOSIS — O09523 Supervision of elderly multigravida, third trimester: Secondary | ICD-10-CM

## 2015-11-25 DIAGNOSIS — O2620 Pregnancy care for patient with recurrent pregnancy loss, unspecified trimester: Secondary | ICD-10-CM

## 2015-11-25 DIAGNOSIS — O34219 Maternal care for unspecified type scar from previous cesarean delivery: Secondary | ICD-10-CM

## 2015-11-25 DIAGNOSIS — O1493 Unspecified pre-eclampsia, third trimester: Secondary | ICD-10-CM

## 2015-11-25 DIAGNOSIS — O24319 Unspecified pre-existing diabetes mellitus in pregnancy, unspecified trimester: Secondary | ICD-10-CM

## 2015-11-25 DIAGNOSIS — E7212 Methylenetetrahydrofolate reductase deficiency: Secondary | ICD-10-CM

## 2015-11-25 DIAGNOSIS — O09899 Supervision of other high risk pregnancies, unspecified trimester: Secondary | ICD-10-CM

## 2015-11-25 DIAGNOSIS — O99283 Endocrine, nutritional and metabolic diseases complicating pregnancy, third trimester: Secondary | ICD-10-CM

## 2015-11-25 LAB — URINALYSIS, ROUTINE W REFLEX MICROSCOPIC
BILIRUBIN URINE: NEGATIVE
GLUCOSE, UA: NEGATIVE mg/dL
HGB URINE DIPSTICK: NEGATIVE
Ketones, ur: 15 mg/dL — AB
Leukocytes, UA: NEGATIVE
Nitrite: NEGATIVE
Protein, ur: NEGATIVE mg/dL
pH: 6 (ref 5.0–8.0)

## 2015-11-25 LAB — CBC
HEMATOCRIT: 33.8 % — AB (ref 36.0–46.0)
Hemoglobin: 11.1 g/dL — ABNORMAL LOW (ref 12.0–15.0)
MCH: 28.9 pg (ref 26.0–34.0)
MCHC: 32.8 g/dL (ref 30.0–36.0)
MCV: 88 fL (ref 78.0–100.0)
PLATELETS: 244 10*3/uL (ref 150–400)
RBC: 3.84 MIL/uL — ABNORMAL LOW (ref 3.87–5.11)
RDW: 15.3 % (ref 11.5–15.5)
WBC: 11.7 10*3/uL — ABNORMAL HIGH (ref 4.0–10.5)

## 2015-11-25 LAB — GLUCOSE, POCT (MANUAL RESULT ENTRY): POC GLUCOSE: 102 mg/dL — AB (ref 70–99)

## 2015-11-25 LAB — PROTEIN, URINE, 24 HOUR
COLLECTION INTERVAL-UPROT: 24 h
Protein, 24H Urine: 300 mg/d — ABNORMAL HIGH (ref 50–100)
Protein, Urine: 10 mg/dL
URINE TOTAL VOLUME-UPROT: 3000 mL

## 2015-11-25 LAB — GLUCOSE, CAPILLARY: Glucose-Capillary: 105 mg/dL — ABNORMAL HIGH (ref 65–99)

## 2015-11-25 MED ORDER — LABETALOL HCL 200 MG PO TABS: 200.0000 mg | ORAL_TABLET | Freq: Three times a day (TID) | ORAL | Status: DC

## 2015-11-25 MED ORDER — GI COCKTAIL ~~LOC~~
30.0000 mL | Freq: Once | ORAL | Status: AC
  Administered 2015-11-25: 30 mL via ORAL
  Filled 2015-11-25: qty 30

## 2015-11-25 NOTE — MAU Note (Signed)
Pt vomited after getting gi cocktail. Dr. Emelda Fear aware.

## 2015-11-25 NOTE — Progress Notes (Signed)
MAU Visit     Chief Complaint:  Nausea; Emesis During Pregnancy; Headache; and Hypertension   Margaret Cabrera is  35 y.o. [id number] at [redacted]w[redacted]d with a PMHx gHTN,T2DM, and pre-eclampsia without severe features presents complaining of Nausea; Emesis During Pregnancy; Headache; and Hypertension  She was discharge this AM after being admitted on 2/15 due to headache with a BP of 160/100 and proteinuria therefore was diagnosed with pre-eclampsia without severe features.  She received betamethasone on 2/15 and 2/16.    On discharge she had a headache (she's had it for 4wks).  Headache is throbbing in temples and going around to the back of her head. Not as severe as when she was admitted on 12/15.  She now notes nausea with dry heaving as well as pain centrally located in her chest that feels like acid reflux.   When she's dry heaving she sees "fire fly type things" but these resolve after vomiting. No blurred vision.   She also feels flushed today and feels "absolutely miserable." Unsure if she's having contractions- having menstrual like cramps. Good fetal movement. No vaginal bleeding or LOF. Mild ankle edema, nothing else. She had a sharp "stabbing pain in the RUQ" earlier but she also had it on the left side and this has dissipated.   Her BP prior to taking the labetalol at 6pm was 160/90s at home, however has not improved. She's very anxious as she initially thought she was ok prior to discharge however later told that she had pre-eclampsia.   Obstetrical/Gynecological History: OB History    Gravida Para Term Preterm AB TAB SAB Ectopic Multiple Living   Past Medical History: Past Medical History  Diagnosis Date  . Diabetes mellitus without complication (HCC)   . Vaginal Pap smear, abnormal   . MTHFR mutation (HCC)   . PONV (postoperative nausea and vomiting)   . Headache   . Asthma     Past Surgical History: Past Surgical History  Procedure Laterality Date  .  Tonsillectomy  1987  . Cesarean section  03-2013  . Cryotherapy    . Dilation and curettage of uterus  09-2007  . Dilation and curettage of uterus  11-2011  . Cholecystectomy  1997  . Urethra stretched      Family History: Family History  Problem Relation Age of Onset  . Cancer Father   . Hypertension Father   . Heart disease Father   . Diabetes Father   . Stroke Brother   . Diabetes Brother   . Stroke Maternal Grandmother     Social History: Social History  Substance Use Topics  . Smoking status: Never Smoker   . Smokeless tobacco: None  . Alcohol Use: No    Allergies:  Allergies  Allergen Reactions  . Prednisone Rash  . Codeine Nausea Only  . Sulfa Antibiotics Rash    Meds:  No prescriptions prior to admission    Review of Systems -   Review of Systems  Constitutional: Negative for fever, chills, weight loss, malaise/fatigue  HENT: Negative for hearing loss, ear pain, nosebleeds, congestion, sore throat, neck pain, tinnitus and ear discharge.   Eyes: Negative for blurred vision, double vision, photophobia, pain, discharge and redness.  Respiratory: Negative for cough, hemoptysis, sputum production, shortness of breath, wheezing and stridor.   Cardiovascular: Negative for chest pain, palpitations, orthopnea,  leg swelling  Gastrointestinal: Positive for abdominal pain heartburn, nausea, vomiting, Negative for  diarrhea, constipation, blood in stool Genitourinary: Negative for dysuria, urgency, frequency, hematuria and flank pain.  Musculoskeletal: Negative for myalgias, back pain, joint pain and falls.  Skin: Negative for itching and rash.  Neurological: Negative for dizziness, tingling, tremors, sensory change, speech change, focal weakness, seizures, loss of consciousness, weakness. Positive for headaches.  Endo/Heme/Allergies: Negative for environmental allergies and polydipsia. Does not bruise/bleed easily.  Psychiatric/Behavioral: Negative for depression,  suicidal ideas, hallucinations, memory loss and substance abuse. The patient is nervous/anxious     Physical Exam  Blood pressure 127/73, pulse 93, temperature 98.1 F (36.7 C), resp. rate 18, height 5' 5.5" (1.664 m), weight 120.112 kg (264 lb 12.8 oz), last menstrual period 02/06/2015. GENERAL: Well-developed, well-nourished female, very anxious and flushed LUNGS: Clear to auscultation bilaterally.  HEART: Regular rate and rhythm. ABDOMEN: Soft, nontender, nondistended, gravid.  EXTREMITIES: Nontender, no edema, 2+ distal pulses. DTR's 2+ in the upper and lower extremities bilaterally.  FHT:  Baseline rate 150 bpm   Variability moderate  Accelerations present   Decelerations none Contractions: none   Labs: Results for orders placed or performed during the hospital encounter of 11/25/15 (from the past 24 hour(s))  Urinalysis, Routine w reflex microscopic (not at [facility])   Collection Time: 11/25/15 10:17 PM  Result Value Ref Range   Color, Urine YELLOW YELLOW   APPearance CLEAR CLEAR   Specific Gravity, Urine >1.030 (H) 1.005 - 1.030   pH 6.0 5.0 - 8.0   Glucose, UA NEGATIVE NEGATIVE mg/dL   Hgb urine dipstick NEGATIVE NEGATIVE   Bilirubin Urine NEGATIVE NEGATIVE   Ketones, ur 15 (A) NEGATIVE mg/dL   Protein, ur NEGATIVE NEGATIVE mg/dL   Nitrite NEGATIVE NEGATIVE   Leukocytes, UA NEGATIVE NEGATIVE  CBC   Collection Time: 11/25/15 11:36 PM  Result Value Ref Range   WBC 11.7 (H) 4.0 - 10.5 K/uL   RBC 3.84 (L) 3.87 - 5.11 MIL/uL   Hemoglobin 11.1 (L) 12.0 - 15.0 g/dL   HCT 16.1 (L) 09.6 - 04.5 %   MCV 88.0 78.0 - 100.0 fL   MCH 28.9 26.0 - 34.0 pg   MCHC 32.8 30.0 - 36.0 g/dL   RDW 40.9 81.1 - 91.4 %   Platelets 244 150 - 400 K/uL  Comprehensive metabolic panel   Collection Time: 11/25/15 11:36 PM  Result Value Ref Range   Sodium 138 135 - 145 mmol/L   Potassium 3.9 3.5 - 5.1 mmol/L   Chloride 108 101 - 111 mmol/L   CO2 20 (L) 22 - 32 mmol/L   Glucose, Bld 100 (H) 65 -  99 mg/dL   BUN 14 6 - 20 mg/dL   Creatinine, Ser 7.82 0.44 - 1.00 mg/dL   Calcium 9.0 8.9 - 95.6 mg/dL   Total Protein 6.9 6.5 - 8.1 g/dL   Albumin 3.2 (L) 3.5 - 5.0 g/dL   AST 17 15 - 41 U/L   ALT 9 (L) 14 - 54 U/L   Alkaline Phosphatase 74 38 - 126 U/L   Total Bilirubin 0.3 0.3 - 1.2 mg/dL   GFR calc non Af Amer >60 >60 mL/min   GFR calc Af Amer >60 >60 mL/min   Anion gap 10 5 - 15  Results for orders placed or performed during the hospital encounter of 11/23/15 (from the past 24 hour(s))  Glucose, capillary   Collection Time: 11/25/15  7:48 AM  Result Value Ref Range   Glucose-Capillary 105 (H) 65 - 99 mg/dL  Results for orders placed or  performed in visit on 11/07/15 (from the past 24 hour(s))  POCT glucose (manual entry)   Collection Time: 11/25/15  9:47 AM  Result Value Ref Range   POC Glucose 102 (A) 70 - 99 mg/dl   Imaging Studies:  [place] Mfm Fetal Bpp Wo Non Stress  11/04/2015  OBSTETRICAL ULTRASOUND: This exam was performed within a [facility] [facility]. The OB US report was generated in the AS system, and faxed to the ordering physician.  This report is available in the [employer]. See the AS Obstetric US report via the Image Link.  [place] Mfm Ob Follow Up  11/21/2015  OBSTETRICAL ULTRASOUND: This exam was performed within a [place] [facility]. The OB US report was generated in the AS system, and faxed to the ordering physician.  This report is available in the [employer]. See the AS Obstetric US report via the Image Link.   Assessment: Margaret Cabrera is  35 y.o. [id number] at [redacted]w[redacted]d presents with worsening headache and a general sense of feeling unwell with normal blood pressures.   1130pm: GI cocktail given to see if this improves centrally located chest pain and nausea. Obtained repeat CMET and CBC  12:15am: Improvement in flushing and reflux sensation. CMET and CBC stable. Urine dipstick negative for protein.   Evaluated and discussed patient  with Dr. Emelda Fear. No evidence of worsening pre-eclampsia from labwork. Would like to make it to scheduled c-section at 37wks if possible. Patient just generally doesn't feel well and endorses issues with sleep which is a major component. I also suspect there may be some component of anxiety as well.   Plan: - Ambien  to help with insomnia  - Patient provided Dr. Rayna Sexton number on his request so she may call as needed. - Patient told by Dr. Emelda Fear that he would be on call Sunday, therefore if her symptoms are worsening, they could consider performing a c-section at that time (considering the worsening headache a severe symptom). - provided strict return precautions.   [employer] 2/18/201712:52 AM

## 2015-11-25 NOTE — Progress Notes (Addendum)
FACULTY PRACTICE ANTEPARTUM(COMPREHENSIVE) NOTE  Margaret Cabrera is a 35 y.o. [id number] with Estimated Date of Delivery: 12/21/15   By  early ultrasound [redacted]w[redacted]d  who is admitted for evaluation of status of her hypertensive disorder of pregnancy.    Fetal presentation is cephalic. Length of Stay:  1  Days  Date of admission:11/23/2015  Subjective: No headache or visual disturbances today Patient reports the fetal movement as active. Patient reports uterine contraction  activity as none. Patient reports  vaginal bleeding as none. Patient describes fluid per vagina as None. Vitals:  Blood pressure 142/85, pulse 111, temperature 98.2 F (36.8 C), temperature source Oral, resp. rate 20, height  (1.651 m), weight 265 lb (120.203 kg), last menstrual period 02/06/2015, SpO2 98 %.  Physical Examination:  General appearance - alert, well appearing, and in no distress Abdomen - benign Extremities - peripheral pulses normal, no pedal edema, no clubbing or cyanosis Fundal Height:  size equals dates  Extremities: extremities normal, atraumatic, no cyanosis or edema with DTRs 2+ bilaterally Membranes:intact  Fetal Monitoring:     reactive  Labs:  24 hour urine in progress  Imaging Studies:      Medications:  Scheduled . aspirin  81 mg Oral Daily  . [employer] PRENATAL GUMMY  1 Package Oral QHS  . docusate sodium  100 mg Oral Daily  . glyBURIDE  2.5 mg Oral QHS  . labetalol  200 mg Oral TID  . metFORMIN  1,000 mg Oral QHS   I have reviewed the patient's current medications.  ASSESSMENT: Margaret Cabrera [redacted]w[redacted]d Estimated Date of Delivery: 12/21/15  Patient Active Problem List   Diagnosis Date Noted  . Gestational hypertension w/o significant proteinuria in 3rd trimester 11/23/2015  . Gestational hypertension, antepartum 10/31/2015  . History of cesarean delivery, antepartum 06/27/2015  . Antepartum multigravida of advanced maternal age 22/22/2016  . High risk multigravida, antepartum 05/30/2015  .  Recurrent pregnancy loss, antepartum condition or complication 05/30/2015  . MTHFR deficiency complicating pregnancy (HCC) 05/30/2015  . Maternal pregestational diabetes classes B through R, antepartum (HCC) 05/30/2015    PLAN: Continue 24 hour urine collection and clinical status monitoring, will get 2nd Betamethasone tonight  [name] H

## 2015-11-25 NOTE — MAU Note (Addendum)
Just d/c home this am from [facility] since Weds night. States on d/c was told did not have preeclampsia but before left was told did have protein in urine and was preeclamptic but was allowed to go home. Scheduled for repeat C/S on 2/22. Tonight started having n/v and headache continues (which has had for 4 wks). Was admitted Weds due to B/Ps and h/a. Took Labetalol tonight at 1800 . Does have some heartburn. No diarrhea

## 2015-11-25 NOTE — Discharge Summary (Addendum)
Physician Discharge Summary  Patient ID: Margaret Cabrera MRN: [id number] DOB/AGE: 04/27/1981 35 y.o.  Admit date: 11/23/2015 Discharge date: 11/25/2015  Admission Diagnoses: Gestational hypertension. 36 weeks.  Discharge Diagnoses: Preeclampsia without severe features at 36 weeks  Discharged Condition: good  Hospital Course: Mitchell Epling is a 35 y.o. female G11 P62 @ 36.0wks by 9wk U/S presenting for eval of H/A. She was dx w/ gHTN approx 3 weeks ago and has had a H/A most of the time since then, usually at a 1-3/10 on the pain scale and responds to Tylenol or Fioricet. On the day of admission, the H/A has gotten progressively worse despite medications. No visual disturbances, RUQ pain, vag bldg or leaking, ctx. She also initially had BP in 160/100s that responded to Labetalol 20 mg IV x 1 in th MAU. Her preg has been followed by the [facility] [place] [facility] office since 10wks and has been remarkable for 1) prev C/S w/ desire for repeat 2) gHTN 3) mTHFR 4) prev macrosomic infant (10+13) 5) B-DM (diet controlled) 6) desires BTL 7) AMA  During admission her headache improved, and her BP improved on Labetalol 200 mg tid.   Her labs were stable but she had a urine protein that ruled in thediagnosis of preeclampsia.  She had no other severe features during admission.  Her CBGs were stable, reassuring FHT. She was deemed stable for discharge to home with outpatient follow up.  Of note, given her new diagnosis, her delivery date was changed from 39 weeks to 37 weeks; her RCS is now scheduled on 11/30/15 at 0900.  Patient does understand that she may need earlier delivery for any concerning severe features.  Consults: None  Significant Diagnostic Studies: 24 hr urine protein 36m  Results for orders placed or performed during the hospital encounter of 11/23/15 (from the past 72 hour(s))  [facility]inal[facility], [facility]     Status: None   Collection Time: 11/23/15  7:00 PM  Result Value  Ref Range   Color, Urine YELLOW YELLOW   APPearance CLEAR CLEAR   Specific Gravity, Urine 1.025 1.005 - 1.030   pH 5.5 5.0 - 8.0   Glucose, UA NEGATIVE NEGATIVE mg/dL   Hgb urine dipstick NEGATIVE NEGATIVE   Bilirubin Urine NEGATIVE NEGATIVE   Ketones, ur NEGATIVE NEGATIVE mg/dL   Protein, ur NEGATIVE NEGATIVE mg/dL   Nitrite NEGATIVE NEGATIVE   Leukocytes, UA NEGATIVE NEGATIVE    Comment: MICROSCOPIC NOT DONE ON URINES WITH NEGATIVE PROTEIN, BLOOD, LEUKOCYTES, NITRITE, OR GLUCOSE <1000 mg/dL.  Protein / creatinine ratio, urine     Status: None   Collection Time: 11/23/15  7:00 PM  Result Value Ref Range   Creatinine, Urine 69.00 mg/dL   Total Protein, Urine 9 mg/dL    Comment: NO NORMAL RANGE ESTABLISHED FOR THIS TEST   Protein Creatinine Ratio 0.13 0.00 - 0.15 mg/mg[Cre]  CBC     Status: Abnormal   Collection Time: 11/23/15  7:38 PM  Result Value Ref Range   WBC 9.7 4.0 - 10.5 K/uL   RBC 4.08 3.87 - 5.11 MIL/uL   Hemoglobin 12.0 12.0 - 15.0 g/dL   HCT 35.6 (L) 36.0 - 46.0 %   MCV 87.3 78.0 - 100.0 fL   MCH 29.4 26.0 - 34.0 pg   MCHC 33.7 30.0 - 36.0 g/dL   RDW 14.9 11.5 - 15.5 %   Platelets 261 150 - 400 K/uL  Comprehensive metabolic panel     Status: Abnormal  Collection Time: 11/23/15  7:38 PM  Result Value Ref Range   Sodium 136 135 - 145 mmol/L   Potassium 4.0 3.5 - 5.1 mmol/L   Chloride 108 101 - 111 mmol/L   CO2 21 (L) 22 - 32 mmol/L   Glucose, Bld 89 65 - 99 mg/dL   BUN 12 6 - 20 mg/dL   Creatinine, Ser 0.56 0.44 - 1.00 mg/dL   Calcium 9.0 8.9 - 10.3 mg/dL   Total Protein 7.2 6.5 - 8.1 g/dL   Albumin 2.9 (L) 3.5 - 5.0 g/dL   AST 13 (L) 15 - 41 U/L   ALT 11 (L) 14 - 54 U/L   Alkaline Phosphatase 77 38 - 126 U/L   Total Bilirubin 0.2 (L) 0.3 - 1.2 mg/dL   GFR calc non Af Amer >60 >60 mL/min   GFR calc Af Amer >60 >60 mL/min    Comment: (NOTE) The eGFR has been calculated using the CKD EPI equation. This calculation has not been validated in all clinical  situations. eGFR's persistently <60 mL/min signify possible Chronic Kidney Disease.    Anion gap 7 5 - 15  Type and screen [place]     Status: None   Collection Time: 11/23/15  7:38 PM  Result Value Ref Range   ABO/RH(D) O POS    Antibody Screen NEG    Sample Expiration 11/26/2015   ABO/Rh     Status: None   Collection Time: 11/23/15  7:38 PM  Result Value Ref Range   ABO/RH(D) O POS   Protein, urine, 24 hour     Status: Abnormal   Collection Time: 11/23/15 11:30 PM  Result Value Ref Range   Urine Total Volume-UPROT 3000 mL   Collection Interval-UPROT 24 hours   Protein, Urine 10 mg/dL   Protein, 24H Urine 300 (H) 50 - 100 mg/day    Comment: Performed at [facility]  Glucose, capillary     Status: Abnormal   Collection Time: 11/24/15 12:24 AM  Result Value Ref Range   Glucose-Capillary 120 (H) 65 - 99 mg/dL  Glucose, capillary     Status: Abnormal   Collection Time: 11/24/15  4:57 AM  Result Value Ref Range   Glucose-Capillary 143 (H) 65 - 99 mg/dL  Glucose, capillary     Status: Abnormal   Collection Time: 11/24/15  8:50 AM  Result Value Ref Range   Glucose-Capillary 120 (H) 65 - 99 mg/dL  Glucose, capillary     Status: None   Collection Time: 11/24/15 12:36 PM  Result Value Ref Range   Glucose-Capillary 99 65 - 99 mg/dL  Glucose, capillary     Status: None   Collection Time: 11/24/15  4:23 PM  Result Value Ref Range   Glucose-Capillary 95 65 - 99 mg/dL  Glucose, capillary     Status: Abnormal   Collection Time: 11/24/15 10:16 PM  Result Value Ref Range   Glucose-Capillary 110 (H) 65 - 99 mg/dL  Glucose, capillary     Status: Abnormal   Collection Time: 11/25/15  7:48 AM  Result Value Ref Range   Glucose-Capillary 105 (H) 65 - 99 mg/dL    Treatments: Labetalol for BP   Discharge Exam: Blood pressure 142/85, pulse 111, temperature 98.2 F (36.8 C), temperature source Oral, resp. rate 20, height _0  (1.651 m), weight 120.203 kg  (265 lb), last menstrual period 02/06/2015, SpO2 98 %. General appearance: alert, cooperative and no distress Lungs: Normal respiratory effort Heart: Regular  rhythm and rate Abdomen: NT, gravid, soft Extremities: extremities normal, atraumatic, no cyanosis or edema, 2+DTRs  Disposition: 01-Home or Self Care     Medication List    ASK your doctor about these medications        acetaminophen 325 MG tablet  Commonly known as:  TYLENOL  Take 650 mg by mouth every 6 (six) hours as needed for headache.     albuterol (2.5 MG/3ML) 0.083% nebulizer solution  Commonly known as:  PROVENTIL  Take 2.5 mg by nebulization every 6 (six) hours as needed for wheezing.     albuterol 108 (90 Base) MCG/ACT inhaler  Commonly known as:  PROVENTIL HFA;VENTOLIN HFA  Inhale 1 puff into the lungs every 6 (six) hours as needed for wheezing or shortness of breath.     aspirin 81 MG EC tablet  Take 81 mg by mouth daily.     Blood Glucose Monitoring Suppl Kit  Use device to check blood sugars 1-2 times daily. Please provide what is covered by insurance     butalbital-acetaminophen-caffeine 50-325-40 MG tablet  Commonly known as:  FIORICET, ESGIC  TAKE 1 TO 2 CAPSULES EVERY 6 HOURS AS NEEDED FOR HEADACHE     cyclobenzaprine 5 MG tablet  Commonly known as:  FLEXERIL  Take 1 tablet (5 mg total) by mouth 3 (three) times daily as needed for muscle spasms.     fluticasone 50 MCG/ACT nasal spray  Commonly known as:  FLONASE  Place 2 sprays into both nostrils daily.     glucose blood test strip  Use device to check blood sugars 1-2 times daily. Please provide what is covered by insurance     glyBURIDE 5 MG tablet  Commonly known as:  DIABETA  Take 0.5 tablets (2.5 mg total) by mouth at bedtime.     Lancets 28G Misc  Use device to check blood sugars 1-2 times daily. Please provide what is covered by insurance     metFORMIN 1000 MG tablet  Commonly known as:  GLUCOPHAGE  Take 1 tablet (1,000 mg  total) by mouth daily.     PRENATAL DHA PO  Take 6 each by mouth daily. Gummies           Follow-up Information    Follow up with [facility] In 3 days.   Specialty:  Obstetrics and Gynecology   Contact information:   [place] [address] [phone]      Signed: Emeterio Reeve, MD and Verita Schneiders, MD 11/25/2015, 7:49 AM

## 2015-11-25 NOTE — Discharge Instructions (Signed)

## 2015-11-26 LAB — COMPREHENSIVE METABOLIC PANEL
ALT: 9 U/L — ABNORMAL LOW (ref 14–54)
AST: 17 U/L (ref 15–41)
Albumin: 3.2 g/dL — ABNORMAL LOW (ref 3.5–5.0)
Alkaline Phosphatase: 74 U/L (ref 38–126)
Anion gap: 10 (ref 5–15)
BUN: 14 mg/dL (ref 6–20)
CHLORIDE: 108 mmol/L (ref 101–111)
CO2: 20 mmol/L — AB (ref 22–32)
Calcium: 9 mg/dL (ref 8.9–10.3)
Creatinine, Ser: 0.6 mg/dL (ref 0.44–1.00)
Glucose, Bld: 100 mg/dL — ABNORMAL HIGH (ref 65–99)
POTASSIUM: 3.9 mmol/L (ref 3.5–5.1)
SODIUM: 138 mmol/L (ref 135–145)
Total Bilirubin: 0.3 mg/dL (ref 0.3–1.2)
Total Protein: 6.9 g/dL (ref 6.5–8.1)

## 2015-11-26 MED ORDER — ZOLPIDEM TARTRATE 5 MG PO TABS: 5.0000 mg | ORAL_TABLET | Freq: Once | ORAL | Status: DC

## 2015-11-26 NOTE — Discharge Instructions (Signed)
The Ambien you received should help with the insomnia and allow you to get a good night's rest. If you need to contact Dr. Emelda Fear, his number is [phone] If you are not feeling any better or feeling worse, please let us know.  Preeclampsia and Eclampsia Preeclampsia is a serious condition that develops only during pregnancy. It is also called toxemia of pregnancy. This condition causes high blood pressure along with other symptoms, such as swelling and headaches. These may develop as the condition gets worse. Preeclampsia may occur 20 weeks or later into your pregnancy.  Diagnosing and treating preeclampsia early is very important. If not treated early, it can cause serious problems for you and your baby. One problem it can lead to is eclampsia, which is a condition that causes muscle jerking or shaking (convulsions) in the mother. Delivering your baby is the best treatment for preeclampsia or eclampsia.  RISK FACTORS The cause of preeclampsia is not known. You may be more likely to develop preeclampsia if you have certain risk factors. These include:   Being pregnant for the first time.  Having preeclampsia in a past pregnancy.  Having a family history of preeclampsia.  Having high blood pressure.  Being pregnant with twins or triplets.  Being 28 or older.  Being African American.  Having kidney disease or diabetes.  Having medical conditions such as lupus or blood diseases.  Being very overweight (obese). SIGNS AND SYMPTOMS  The earliest signs of preeclampsia are:  High blood pressure.  Increased protein in your urine. Your health care provider will check for this at every prenatal visit. Other symptoms that can develop include:   Severe headaches.  Sudden weight gain.  Swelling of your hands, face, legs, and feet.  Feeling sick to your stomach (nauseous) and throwing up (vomiting).  Vision problems (blurred or double vision).  Numbness in your face, arms, legs,  and feet.  Dizziness.  Slurred speech.  Sensitivity to bright lights.  Abdominal pain. DIAGNOSIS  There are no screening tests for preeclampsia. Your health care provider will ask you about symptoms and check for signs of preeclampsia during your prenatal visits. You may also have tests, including:  Urine testing.  Blood testing.  Checking your baby's heart rate.  Checking the health of your baby and your placenta using images created with sound waves (ultrasound). TREATMENT  You can work out the best treatment approach together with your health care provider. It is very important to keep all prenatal appointments. If you have an increased risk of preeclampsia, you may need more frequent prenatal exams.  Your health care provider may prescribe bed rest.  You may have to eat as little salt as possible.  You may need to take medicine to lower your blood pressure if the condition does not respond to more conservative measures.  You may need to stay in the hospital if your condition is severe. There, treatment will focus on controlling your blood pressure and fluid retention. You may also need to take medicine to prevent seizures.  If the condition gets worse, your baby may need to be delivered early to protect you and the baby. You may have your labor started with medicine (be induced), or you may have a cesarean delivery.  Preeclampsia usually goes away after the baby is born. HOME CARE INSTRUCTIONS   Only take over-the-counter or prescription medicines as directed by your health care provider.  Lie on your left side while resting. This keeps pressure off your baby.  Elevate your feet while resting.  Get regular exercise. Ask your health care provider what type of exercise is safe for you.  Avoid caffeine and alcohol.  Do not smoke.  Drink 6-8 glasses of water every day.  Eat a balanced diet that is low in salt. Do not add salt to your food.  Avoid stressful  situations as much as possible.  Get plenty of rest and sleep.  Keep all prenatal appointments and tests as scheduled. SEEK MEDICAL CARE IF:  You are gaining more weight than expected.  You have any headaches, abdominal pain, or nausea.  You are bruising more than usual.  You feel dizzy or light-headed. SEEK IMMEDIATE MEDICAL CARE IF:   You develop sudden or severe swelling anywhere in your body. This usually happens in the legs.  You gain 5 lb (2.3 kg) or more in a week.  You have a severe headache, dizziness, problems with your vision, or confusion.  You have severe abdominal pain.  You have lasting nausea or vomiting.  You have a seizure.  You have trouble moving any part of your body.  You develop numbness in your body.  You have trouble speaking.  You have any abnormal bleeding.  You develop a stiff neck.  You pass out. MAKE SURE YOU:   Understand these instructions.  Will watch your condition.  Will get help right away if you are not doing well or get worse.   This information is not intended to replace advice given to you by your health care provider. Make sure you discuss any questions you have with your health care provider.   Document Released: 09/21/2000 Document Revised: 09/29/2013 Document Reviewed: 07/17/2013 Elsevier Interactive Patient Education [employer]

## 2015-11-27 ENCOUNTER — Inpatient Hospital Stay (HOSPITAL_COMMUNITY): Payer: Managed Care, Other (non HMO) | Admitting: Anesthesiology

## 2015-11-27 ENCOUNTER — Inpatient Hospital Stay (HOSPITAL_COMMUNITY)
Admission: AD | Admit: 2015-11-27 | Discharge: 2015-11-29 | Disposition: A | Discharge status: Home / Self Care | Payer: Managed Care, Other (non HMO) | Source: Ambulatory Visit | Attending: Obstetrics and Gynecology | Admitting: Obstetrics and Gynecology

## 2015-11-27 ENCOUNTER — Encounter (HOSPITAL_COMMUNITY): Payer: Self-pay | Admitting: *Deleted

## 2015-11-27 ENCOUNTER — Encounter (HOSPITAL_COMMUNITY)
Admission: AD | Disposition: A | Discharge status: Home / Self Care | Payer: Self-pay | Source: Ambulatory Visit | Attending: Obstetrics and Gynecology

## 2015-11-27 DIAGNOSIS — Z302 Encounter for sterilization: Secondary | ICD-10-CM

## 2015-11-27 DIAGNOSIS — Z79899 Other long term (current) drug therapy: Secondary | ICD-10-CM

## 2015-11-27 DIAGNOSIS — Z7984 Long term (current) use of oral hypoglycemic drugs: Secondary | ICD-10-CM

## 2015-11-27 DIAGNOSIS — Z3A36 36 weeks gestation of pregnancy: Secondary | ICD-10-CM

## 2015-11-27 DIAGNOSIS — O09523 Supervision of elderly multigravida, third trimester: Secondary | ICD-10-CM

## 2015-11-27 DIAGNOSIS — O1414 Severe pre-eclampsia complicating childbirth: Secondary | ICD-10-CM | POA: Diagnosis present

## 2015-11-27 DIAGNOSIS — O2492 Unspecified diabetes mellitus in childbirth: Secondary | ICD-10-CM | POA: Diagnosis present

## 2015-11-27 DIAGNOSIS — Z98891 History of uterine scar from previous surgery: Secondary | ICD-10-CM

## 2015-11-27 DIAGNOSIS — O99283 Endocrine, nutritional and metabolic diseases complicating pregnancy, third trimester: Secondary | ICD-10-CM

## 2015-11-27 DIAGNOSIS — J45909 Unspecified asthma, uncomplicated: Secondary | ICD-10-CM | POA: Diagnosis present

## 2015-11-27 DIAGNOSIS — O9952 Diseases of the respiratory system complicating childbirth: Secondary | ICD-10-CM

## 2015-11-27 DIAGNOSIS — G4700 Insomnia, unspecified: Secondary | ICD-10-CM | POA: Diagnosis present

## 2015-11-27 DIAGNOSIS — O2620 Pregnancy care for patient with recurrent pregnancy loss, unspecified trimester: Secondary | ICD-10-CM

## 2015-11-27 DIAGNOSIS — O1490 Unspecified pre-eclampsia, unspecified trimester: Secondary | ICD-10-CM | POA: Diagnosis present

## 2015-11-27 DIAGNOSIS — O24319 Unspecified pre-existing diabetes mellitus in pregnancy, unspecified trimester: Secondary | ICD-10-CM

## 2015-11-27 DIAGNOSIS — O1493 Unspecified pre-eclampsia, third trimester: Secondary | ICD-10-CM

## 2015-11-27 DIAGNOSIS — O09899 Supervision of other high risk pregnancies, unspecified trimester: Secondary | ICD-10-CM

## 2015-11-27 DIAGNOSIS — O34219 Maternal care for unspecified type scar from previous cesarean delivery: Secondary | ICD-10-CM | POA: Diagnosis present

## 2015-11-27 DIAGNOSIS — E7212 Methylenetetrahydrofolate reductase deficiency: Secondary | ICD-10-CM

## 2015-11-27 DIAGNOSIS — Z7982 Long term (current) use of aspirin: Secondary | ICD-10-CM

## 2015-11-27 LAB — TYPE AND SCREEN
ABO/RH(D): O POS
ANTIBODY SCREEN: NEGATIVE

## 2015-11-27 LAB — URINE MICROSCOPIC-ADD ON: WBC UA: NONE SEEN WBC/hpf (ref 0–5)

## 2015-11-27 LAB — CBC
HCT: 37 % (ref 36.0–46.0)
HEMOGLOBIN: 12.5 g/dL (ref 12.0–15.0)
MCH: 29.6 pg (ref 26.0–34.0)
MCHC: 33.8 g/dL (ref 30.0–36.0)
MCV: 87.5 fL (ref 78.0–100.0)
PLATELETS: 252 10*3/uL (ref 150–400)
RBC: 4.23 MIL/uL (ref 3.87–5.11)
RDW: 15.2 % (ref 11.5–15.5)
WBC: 11.3 10*3/uL — AB (ref 4.0–10.5)

## 2015-11-27 LAB — URINALYSIS, ROUTINE W REFLEX MICROSCOPIC
BILIRUBIN URINE: NEGATIVE
Glucose, UA: NEGATIVE mg/dL
KETONES UR: NEGATIVE mg/dL
LEUKOCYTES UA: NEGATIVE
NITRITE: NEGATIVE
PROTEIN: NEGATIVE mg/dL
Specific Gravity, Urine: 1.025 (ref 1.005–1.030)
pH: 5.5 (ref 5.0–8.0)

## 2015-11-27 LAB — PROTEIN / CREATININE RATIO, URINE
CREATININE, URINE: 91 mg/dL
PROTEIN CREATININE RATIO: 0.08 mg/mg{creat} (ref 0.00–0.15)
TOTAL PROTEIN, URINE: 7 mg/dL

## 2015-11-27 LAB — GLUCOSE, CAPILLARY: Glucose-Capillary: 91 mg/dL (ref 65–99)

## 2015-11-27 SURGERY — Surgical Case
Anesthesia: Spinal

## 2015-11-27 MED ORDER — NALOXONE HCL 0.4 MG/ML IJ SOLN: 0.4000 mg | INTRAMUSCULAR | Status: DC | PRN

## 2015-11-27 MED ORDER — LANOLIN HYDROUS EX OINT: 1.0000 "application " | TOPICAL_OINTMENT | CUTANEOUS | Status: DC | PRN

## 2015-11-27 MED ORDER — CITRIC ACID-SODIUM CITRATE 334-500 MG/5ML PO SOLN
30.0000 mL | Freq: Once | ORAL | Status: AC
Start: 2015-11-27 — End: 2015-11-27
  Administered 2015-11-27: 30 mL via ORAL
  Filled 2015-11-27: qty 15

## 2015-11-27 MED ORDER — SCOPOLAMINE 1 MG/3DAYS TD PT72
MEDICATED_PATCH | TRANSDERMAL | Status: DC | PRN
  Administered 2015-11-27: 1 via TRANSDERMAL

## 2015-11-27 MED ORDER — ONDANSETRON HCL 4 MG/2ML IJ SOLN: 4.0000 mg | Freq: Three times a day (TID) | INTRAMUSCULAR | Status: DC | PRN

## 2015-11-27 MED ORDER — NALBUPHINE HCL 10 MG/ML IJ SOLN: 5.0000 mg | Freq: Once | INTRAMUSCULAR | Status: DC | PRN

## 2015-11-27 MED ORDER — FENTANYL CITRATE (PF) 100 MCG/2ML IJ SOLN
INTRAMUSCULAR | Status: AC
  Filled 2015-11-27: qty 2

## 2015-11-27 MED ORDER — SIMETHICONE 80 MG PO CHEW
80.0000 mg | CHEWABLE_TABLET | ORAL | Status: DC | PRN
  Administered 2015-11-29: 80 mg via ORAL
  Filled 2015-11-27: qty 1

## 2015-11-27 MED ORDER — ACETAMINOPHEN 325 MG PO TABS
650.0000 mg | ORAL_TABLET | ORAL | Status: DC | PRN
  Administered 2015-11-29 (×2): 650 mg via ORAL
  Filled 2015-11-27 (×2): qty 2

## 2015-11-27 MED ORDER — SIMETHICONE 80 MG PO CHEW
80.0000 mg | CHEWABLE_TABLET | ORAL | Status: DC
  Administered 2015-11-28: 80 mg via ORAL
  Filled 2015-11-27: qty 1

## 2015-11-27 MED ORDER — LACTATED RINGERS IV SOLN
INTRAVENOUS | Status: DC | PRN
  Administered 2015-11-27: 17:00:00 via INTRAVENOUS

## 2015-11-27 MED ORDER — NALBUPHINE HCL 10 MG/ML IJ SOLN: 5.0000 mg | INTRAMUSCULAR | Status: DC | PRN

## 2015-11-27 MED ORDER — KETOROLAC TROMETHAMINE 30 MG/ML IJ SOLN: 30.0000 mg | Freq: Four times a day (QID) | INTRAMUSCULAR | Status: AC | PRN

## 2015-11-27 MED ORDER — SODIUM CHLORIDE 0.9 % IV SOLN
INTRAVENOUS | Status: DC
  Administered 2015-11-27: 16:00:00 via INTRAVENOUS

## 2015-11-27 MED ORDER — BUPIVACAINE IN DEXTROSE 0.75-8.25 % IT SOLN
INTRATHECAL | Status: DC | PRN
  Administered 2015-11-27: 1.6 mL via INTRATHECAL

## 2015-11-27 MED ORDER — ZOLPIDEM TARTRATE 5 MG PO TABS: 5.0000 mg | ORAL_TABLET | Freq: Every evening | ORAL | Status: DC | PRN

## 2015-11-27 MED ORDER — SODIUM CHLORIDE 0.9% FLUSH: 3.0000 mL | INTRAVENOUS | Status: DC | PRN

## 2015-11-27 MED ORDER — DIPHENHYDRAMINE HCL 25 MG PO CAPS: 25.0000 mg | ORAL_CAPSULE | Freq: Four times a day (QID) | ORAL | Status: DC | PRN

## 2015-11-27 MED ORDER — LACTATED RINGERS IV SOLN
INTRAVENOUS | Status: DC | PRN
  Administered 2015-11-27: 16:00:00 via INTRAVENOUS

## 2015-11-27 MED ORDER — PRENATAL MULTIVITAMIN CH
1.0000 | ORAL_TABLET | Freq: Every day | ORAL | Status: DC
  Filled 2015-11-27: qty 1

## 2015-11-27 MED ORDER — DIPHENHYDRAMINE HCL 25 MG PO CAPS: 25.0000 mg | ORAL_CAPSULE | ORAL | Status: DC | PRN

## 2015-11-27 MED ORDER — MAGNESIUM SULFATE BOLUS VIA INFUSION
4.0000 g | Freq: Once | INTRAVENOUS | Status: AC
Start: 2015-11-27 — End: 2015-11-27
  Administered 2015-11-27: 4 g via INTRAVENOUS
  Filled 2015-11-27: qty 500

## 2015-11-27 MED ORDER — DIPHENHYDRAMINE HCL 50 MG/ML IJ SOLN
12.5000 mg | INTRAMUSCULAR | Status: DC | PRN
  Administered 2015-11-27: 12.5 mg via INTRAVENOUS
  Filled 2015-11-27: qty 1

## 2015-11-27 MED ORDER — OXYTOCIN 10 UNIT/ML IJ SOLN
INTRAMUSCULAR | Status: AC
  Filled 2015-11-27: qty 4

## 2015-11-27 MED ORDER — MORPHINE SULFATE (PF) 0.5 MG/ML IJ SOLN
INTRAMUSCULAR | Status: AC
  Filled 2015-11-27: qty 10

## 2015-11-27 MED ORDER — PHENYLEPHRINE 8 MG IN D5W 100 ML (0.08MG/ML) PREMIX OPTIME
INJECTION | INTRAVENOUS | Status: DC | PRN
  Administered 2015-11-27: 20 ug/min via INTRAVENOUS

## 2015-11-27 MED ORDER — TETANUS-DIPHTH-ACELL PERTUSSIS 5-2.5-18.5 LF-MCG/0.5 IM SUSP: 0.5000 mL | Freq: Once | INTRAMUSCULAR | Status: DC

## 2015-11-27 MED ORDER — DIBUCAINE 1 % RE OINT
1.0000 "application " | TOPICAL_OINTMENT | RECTAL | Status: DC | PRN
  Filled 2015-11-27: qty 28

## 2015-11-27 MED ORDER — PHENYLEPHRINE 8 MG IN D5W 100 ML (0.08MG/ML) PREMIX OPTIME
INJECTION | INTRAVENOUS | Status: AC
  Filled 2015-11-27: qty 100

## 2015-11-27 MED ORDER — FAMOTIDINE IN NACL 20-0.9 MG/50ML-% IV SOLN
20.0000 mg | Freq: Once | INTRAVENOUS | Status: AC
  Administered 2015-11-27: 20 mg via INTRAVENOUS
  Filled 2015-11-27: qty 50

## 2015-11-27 MED ORDER — MENTHOL 3 MG MT LOZG
1.0000 | LOZENGE | OROMUCOSAL | Status: DC | PRN
Start: 2015-11-27 — End: 2015-11-29
  Filled 2015-11-27: qty 9

## 2015-11-27 MED ORDER — SCOPOLAMINE 1 MG/3DAYS TD PT72
MEDICATED_PATCH | TRANSDERMAL | Status: AC
  Filled 2015-11-27: qty 1

## 2015-11-27 MED ORDER — NALOXONE HCL 2 MG/2ML IJ SOSY: 1.0000 ug/kg/h | PREFILLED_SYRINGE | INTRAVENOUS | Status: DC | PRN

## 2015-11-27 MED ORDER — KETOROLAC TROMETHAMINE 30 MG/ML IJ SOLN
INTRAMUSCULAR | Status: AC
  Filled 2015-11-27: qty 1

## 2015-11-27 MED ORDER — PROMETHAZINE HCL 25 MG/ML IJ SOLN: 6.2500 mg | INTRAMUSCULAR | Status: DC | PRN

## 2015-11-27 MED ORDER — DEXTROSE 5 % IV SOLN
3.0000 g | Freq: Once | INTRAVENOUS | Status: AC
  Administered 2015-11-27: 3 g via INTRAVENOUS
  Filled 2015-11-27: qty 3000

## 2015-11-27 MED ORDER — ONDANSETRON HCL 4 MG/2ML IJ SOLN
INTRAMUSCULAR | Status: DC | PRN
  Administered 2015-11-27: 4 mg via INTRAVENOUS

## 2015-11-27 MED ORDER — HYDROMORPHONE HCL 1 MG/ML IJ SOLN: 0.2500 mg | INTRAMUSCULAR | Status: DC | PRN

## 2015-11-27 MED ORDER — ONDANSETRON HCL 4 MG/2ML IJ SOLN
INTRAMUSCULAR | Status: AC
  Filled 2015-11-27: qty 2

## 2015-11-27 MED ORDER — FENTANYL CITRATE (PF) 100 MCG/2ML IJ SOLN
INTRAMUSCULAR | Status: DC | PRN
  Administered 2015-11-27: 20 ug via INTRAVENOUS

## 2015-11-27 MED ORDER — SENNOSIDES-DOCUSATE SODIUM 8.6-50 MG PO TABS
2.0000 | ORAL_TABLET | ORAL | Status: DC
  Administered 2015-11-28: 2 via ORAL
  Filled 2015-11-27: qty 2

## 2015-11-27 MED ORDER — MAGNESIUM SULFATE 50 % IJ SOLN
2.0000 g/h | INTRAMUSCULAR | Status: AC
  Administered 2015-11-27 – 2015-11-28 (×2): 2 g/h via INTRAVENOUS
  Filled 2015-11-27 (×2): qty 80

## 2015-11-27 MED ORDER — WITCH HAZEL-GLYCERIN EX PADS: 1.0000 "application " | MEDICATED_PAD | CUTANEOUS | Status: DC | PRN

## 2015-11-27 MED ORDER — LACTATED RINGERS IV SOLN
INTRAVENOUS | Status: DC
  Administered 2015-11-27 – 2015-11-28 (×2): via INTRAVENOUS

## 2015-11-27 MED ORDER — MEPERIDINE HCL 25 MG/ML IJ SOLN: 6.2500 mg | INTRAMUSCULAR | Status: DC | PRN

## 2015-11-27 MED ORDER — KETOROLAC TROMETHAMINE 30 MG/ML IJ SOLN
30.0000 mg | Freq: Four times a day (QID) | INTRAMUSCULAR | Status: AC | PRN
  Administered 2015-11-27: 30 mg via INTRAMUSCULAR

## 2015-11-27 MED ORDER — MORPHINE SULFATE (PF) 0.5 MG/ML IJ SOLN
INTRAMUSCULAR | Status: DC | PRN
  Administered 2015-11-27: .2 mg via EPIDURAL

## 2015-11-27 MED ORDER — OXYTOCIN 10 UNIT/ML IJ SOLN: 2.5000 [IU]/h | INTRAVENOUS | Status: AC

## 2015-11-27 MED ORDER — LACTATED RINGERS IV SOLN
40.0000 [IU] | INTRAVENOUS | Status: DC | PRN
  Administered 2015-11-27: 40 [IU] via INTRAVENOUS

## 2015-11-27 MED ORDER — SIMETHICONE 80 MG PO CHEW
80.0000 mg | CHEWABLE_TABLET | Freq: Three times a day (TID) | ORAL | Status: DC
  Administered 2015-11-27 – 2015-11-29 (×4): 80 mg via ORAL
  Filled 2015-11-27 (×4): qty 1

## 2015-11-27 MED ORDER — IBUPROFEN 600 MG PO TABS
600.0000 mg | ORAL_TABLET | Freq: Four times a day (QID) | ORAL | Status: DC
  Administered 2015-11-28 – 2015-11-29 (×7): 600 mg via ORAL
  Filled 2015-11-27 (×7): qty 1

## 2015-11-27 MED ORDER — SCOPOLAMINE 1 MG/3DAYS TD PT72: 1.0000 | MEDICATED_PATCH | Freq: Once | TRANSDERMAL | Status: DC

## 2015-11-27 SURGICAL SUPPLY — 37 items
BENZOIN TINCTURE PRP APPL 2/3 (GAUZE/BANDAGES/DRESSINGS) ×2 IMPLANT
CLAMP CORD UMBIL (MISCELLANEOUS) IMPLANT
CLIP FILSHIE TUBAL LIGA STRL (Clip) ×4 IMPLANT
CLOSURE STERI STRIP 1/2 X4 (GAUZE/BANDAGES/DRESSINGS) ×2 IMPLANT
CLOTH BEACON ORANGE TIMEOUT ST (SAFETY) ×2 IMPLANT
DRSG OPSITE POSTOP 4X10 (GAUZE/BANDAGES/DRESSINGS) ×2 IMPLANT
DURAPREP 26ML APPLICATOR (WOUND CARE) ×2 IMPLANT
ELECT REM PT RETURN 9FT ADLT (ELECTROSURGICAL) ×2
ELECTRODE REM PT RTRN 9FT ADLT (ELECTROSURGICAL) ×1 IMPLANT
EXTRACTOR VACUUM KIWI (MISCELLANEOUS) IMPLANT
GLOVE BIO SURGEON ST LM GN SZ9 (GLOVE) ×2 IMPLANT
GLOVE BIOGEL PI IND STRL 7.0 (GLOVE) ×1 IMPLANT
GLOVE BIOGEL PI IND STRL 9 (GLOVE) ×1 IMPLANT
GLOVE BIOGEL PI INDICATOR 7.0 (GLOVE) ×1
GLOVE BIOGEL PI INDICATOR 9 (GLOVE) ×1
GOWN STRL REUS W/TWL 2XL LVL3 (GOWN DISPOSABLE) ×2 IMPLANT
GOWN STRL REUS W/TWL LRG LVL3 (GOWN DISPOSABLE) ×2 IMPLANT
NEEDLE HYPO 25X5/8 SAFETYGLIDE (NEEDLE) IMPLANT
NS IRRIG 1000ML POUR BTL (IV SOLUTION) ×2 IMPLANT
PACK C SECTION WH (CUSTOM PROCEDURE TRAY) ×2 IMPLANT
PAD OB MATERNITY 4.3X12.25 (PERSONAL CARE ITEMS) ×2 IMPLANT
PENCIL SMOKE EVAC W/HOLSTER (ELECTROSURGICAL) ×2 IMPLANT
RETRACTOR TRAXI PANNICULUS (MISCELLANEOUS) ×2 IMPLANT
RTRCTR C-SECT PINK 25CM LRG (MISCELLANEOUS) IMPLANT
RTRCTR C-SECT PINK 34CM XLRG (MISCELLANEOUS) IMPLANT
STRIP CLOSURE SKIN 1/2X4 (GAUZE/BANDAGES/DRESSINGS) IMPLANT
SUT MNCRL 0 VIOLET CTX 36 (SUTURE) ×2 IMPLANT
SUT MONOCRYL 0 CTX 36 (SUTURE) ×2
SUT VIC AB 0 CT1 27 (SUTURE) ×1
SUT VIC AB 0 CT1 27XBRD ANBCTR (SUTURE) ×1 IMPLANT
SUT VIC AB 2-0 CT1 27 (SUTURE) ×1
SUT VIC AB 2-0 CT1 TAPERPNT 27 (SUTURE) ×1 IMPLANT
SUT VIC AB 4-0 KS 27 (SUTURE) ×2 IMPLANT
SYR BULB IRRIGATION 50ML (SYRINGE) IMPLANT
TOWEL OR 17X24 6PK STRL BLUE (TOWEL DISPOSABLE) ×2 IMPLANT
TRAXI PANNICULUS RETRACTOR (MISCELLANEOUS) ×2
TRAY FOLEY CATH SILVER 14FR (SET/KITS/TRAYS/PACK) ×2 IMPLANT

## 2015-11-27 NOTE — Anesthesia Preprocedure Evaluation (Addendum)
Anesthesia Evaluation  Patient identified by MRN, date of birth, ID band Patient awake  General Assessment Comment:Past Medical History Diagnosis Date . Diabetes mellitus without complication (HCC)  . Vaginal Pap smear, abnormal  . MTHFR mutation (HCC)  . PONV (postoperative nausea and vomiting)  . Headache  . Asthma    Past Surgical History Procedure Laterality Date . Tonsillectomy  1987 . Cesarean section  03-2013 . Cryotherapy   . Dilation and curettage of uterus  09-2007 . Dilation and curettage of uterus  11-2011 . Cholecystectomy  1997 . Urethra stretched         Reviewed: Allergy & Precautions, NPO status , Patient's Chart, lab work & pertinent test results  History of Anesthesia Complications (+) PONV and history of anesthetic complications  Airway Mallampati: II  TM Distance: >3 FB Neck ROM: Full    Dental no notable dental hx.    Pulmonary asthma ,    Pulmonary exam normal breath sounds clear to auscultation       Cardiovascular hypertension, negative cardio ROS Normal cardiovascular exam Rhythm:Regular Rate:Normal     Neuro/Psych  Headaches, negative psych ROS   GI/Hepatic negative GI ROS, Neg liver ROS,   Endo/Other  diabetes, Type 2, Oral Hypoglycemic AgentsMorbid obesity  Renal/GU negative Renal ROS  negative genitourinary   Musculoskeletal negative musculoskeletal ROS (+)   Abdominal (+) + obese,   Peds negative pediatric ROS (+)  Hematology negative hematology ROS (+)   Anesthesia Other Findings   Reproductive/Obstetrics negative OB ROS                            Anesthesia Physical Anesthesia Plan  ASA: III  Anesthesia Plan: Spinal   Post-op Pain Management:    Induction: Intravenous  Airway Management Planned: Natural Airway  Additional Equipment:   Intra-op Plan:   Post-operative Plan:    Informed Consent: I have reviewed the patients History and Physical, chart, labs and discussed the procedure including the risks, benefits and alternatives for the proposed anesthesia with the patient or authorized representative who has indicated his/her understanding and acceptance.   Dental advisory given  Plan Discussed with: CRNA  Anesthesia Plan Comments:        Anesthesia Quick Evaluation

## 2015-11-27 NOTE — MAU Note (Addendum)
Pt transported to OR.  Magnesium Bolus still running.

## 2015-11-27 NOTE — Op Note (Signed)
Tilda Burrow, MD Physician Signed Obstetrics/Gynecology Brief Op Note 11/27/2015 5:32 PM    Expand All Collapse All   11/27/2015  5:32 PM  PATIENT: Margaret Cabrera 35 y.o. female  PRE-OPERATIVE DIAGNOSIS: Pregnancy 36 weeks 4 days preeclampsia with severe features, (headache, vision complaints) repeat cesarean section desires elective permanent sterilization  POST-OPERATIVE DIAGNOSIS: Same cesarean section, delivered  PROCEDURE: Procedure(s): CESAREAN SECTION (N/A) repeat low transverse cervical, bilateral tubal sterilization Filshie clips  SURGEON: Surgeon(s) and Role:  * Tilda Burrow, MD - Primary  PHYSICIAN ASSISTANT:   ASSISTANTS: none   ANESTHESIA: spinal  EBL: Total I/O In: -  Out: 750 [Urine:250; Blood:500]  BLOOD ADMINISTERED:none  DRAINS: Urinary Catheter (Foley)   LOCAL MEDICATIONS USED: NONE  SPECIMEN: Source of Specimen: Placenta to labor and delivery  DISPOSITION OF SPECIMEN: Labor and delivery  COUNTS: YES  TOURNIQUET: * No tourniquets in log *  DICTATION: .Dragon Dictation  PLAN OF CARE: Admit to inpatient   PATIENT DISPOSITION: PACU - hemodynamically stable.  Delay start of Pharmacological VTE agent (>24hrs) due to surgical blood loss or risk of bleeding: Not applicable      Details of procedure was taken operating room prepped and draped for lower abdominal surgery. The abdomen was taped upward to allow access to the previous incision site beneath the large panniculus. An transverse incision was repeated after timeout, and Ancef was administered. Transverse incision was performed without difficulty and peritoneal cavity entered without difficulty. The patient had wide rectus diastases. The Alexis wound retractor was positioned, the bladder flap performed, and transverse uterine incision performed and a thin lower uterine segment. Amniotic fluid was clear. The fetal vertex was guided through the incision and a large  healthy-appearing female infant was delivered without difficulty. Cord was clamped at 1 minute, bulb suctioning having been performed as soon as the baby was out. Placenta uterine massage and IV oxytocin and membranes intact. Was hemostatic and firm. 2 layer uterine running locking first layer in continuous running second layer closure of the uterus followed. The abdomen was irrigated, suctioned and confirmed as hemostatic. Tubal ligation. The tube was then identified and confirmed as normal with Filshie clip placed at its midportion. The ovaries were visually normal laparotomy equipment was removed, and the anterior peritoneum closed with 2-0  Vicryl, and the fascia closed with 0 Vicryl. Subcutaneous tissues were reapproximated with interrupted 2-0 Vicryl and then subcuticular 4-0 Vicryl close the skin incision. EBL was 500 cc. Steri-Strips were applied with benzoin and honeycomb dressing. Patient to recovery room in stable condition where she'll receive magnesium sulfate and which will be continued for 24 hours

## 2015-11-27 NOTE — Brief Op Note (Signed)
11/27/2015  5:32 PM  PATIENT:  Margaret Cabrera  35 y.o. female  PRE-OPERATIVE DIAGNOSIS: Pregnancy 36 weeks 4 days preeclampsia with severe features, (headache, vision complaints)  repeat cesarean section desires elective permanent sterilization  POST-OPERATIVE DIAGNOSIS:  Same cesarean section, delivered  PROCEDURE:  Procedure(s): CESAREAN SECTION (N/A) repeat low transverse cervical, bilateral tubal sterilization Filshie clips  SURGEON:  Surgeon(s) and Role:    * Tilda Burrow, MD - Primary  PHYSICIAN ASSISTANT:   ASSISTANTS: none   ANESTHESIA:   spinal  EBL:  Total I/O In: -  Out: 750 [Urine:250; Blood:500]  BLOOD ADMINISTERED:none  DRAINS: Urinary Catheter (Foley)   LOCAL MEDICATIONS USED:  NONE  SPECIMEN:  Source of Specimen:  Placenta to labor and delivery  DISPOSITION OF SPECIMEN:  Labor and delivery  COUNTS:  YES  TOURNIQUET:  * No tourniquets in log *  DICTATION: .Dragon Dictation  PLAN OF CARE: Admit to inpatient   PATIENT DISPOSITION:  PACU - hemodynamically stable.   Delay start of Pharmacological VTE agent (>24hrs) due to surgical blood loss or risk of bleeding: Not applicable

## 2015-11-27 NOTE — Anesthesia Postprocedure Evaluation (Signed)
Anesthesia Post Note  Patient: Margaret Cabrera  Procedure(s) Performed: Procedure(s) (LRB): CESAREAN SECTION (N/A)  Patient location during evaluation: PACU Anesthesia Type: Spinal Level of consciousness: oriented and awake and alert Pain management: pain level controlled Vital Signs Assessment: post-procedure vital signs reviewed and stable Respiratory status: spontaneous breathing, respiratory function stable and patient connected to nasal cannula oxygen Cardiovascular status: blood pressure returned to baseline and stable Postop Assessment: no headache, no backache, spinal receding and patient able to bend at knees Anesthetic complications: no    Last Vitals:  Filed Vitals:   11/27/15 1848 11/27/15 1902  BP:  133/59  Pulse:  84  Temp: 36.5 C 36.8 C  Resp:  18    Last Pain:  Filed Vitals:   11/27/15 1904  PainSc: 2                  Joliyah Lippens J

## 2015-11-27 NOTE — H&P (Signed)
Margaret Kind, MD Physician Signed Obstetrics/Gynecology MAU Provider Note 11/27/2015 3:31 PM    Expand All Collapse All    History     CSN: [id number]  Arrival date and time: 11/27/15 1430  This [id number] 325w4d is seen again at the MAU for symptoms of progressive headache, descirbed as severe with pain rated a 7-9, accompanied by vision changes that have developed over the last 2 days where she notes decreased visual acuity. The patient has had some chronic headache complaints x 4 wks, but this seems progressive, and with the visual complaints as well, I feel the benefit of delivery exceeds the desire to achieve 37 wk. I consider the progression to meed the criteria for severe features of preeclampsia. The dx of preE was confirmed earlier with proteinuria over 300 mg/d at recent hospitalization. The pt had BMZ administered last week. NICU is approving of delivery here. The patient has requested permanent sterilization at the time of cesarean, had mentioned this at prior MAU eval last Friday, and has been counselled over the permanency of requested procedure and its success rates. Filshie Clips are planned. Pt last ate at 9:30 am.    Chief Complaint  Patient presents with  . Hypertension   HPI    Past Medical History  Diagnosis Date  . [place] complication [place]  . Vaginal Pap smear, abnormal   . [place]FR mutation ([facility]  . PONV (postoperative nausea and vomiting)   . Headache   . Asthma     Past Surgical History  Procedure Laterality Date  . Tonsillectomy  1987  . Cesarean section  03-2013  . Cryotherapy    . Dilation and curettage of uterus  09-2007  . Dilation and curettage of uterus  11-2011  . Cholecystectomy  1997  . Urethra stretched      Family History  Problem Relation Age of Onset  . Cancer Father   . Hypertension Father   . Heart disease Father    . Diabetes Father   . Stroke Brother   . Diabetes Brother   . Stroke Maternal Grandmother     Social History  Substance Use Topics  . Smoking status: Never Smoker   . Smokeless tobacco: None  . Alcohol Use: No    Allergies:  Allergies  Allergen Reactions  . Prednisone Rash  . Codeine Nausea Only  . Sulfa Antibiotics Rash    Prescriptions prior to admission  Medication Sig Dispense Refill Last Dose  . acetaminophen (TYLENOL) 325 MG tablet Take 650 mg by mouth every 6 (six) hours as needed for headache.   11/23/2015 at 1300  . albuterol (PROVENTIL HFA;VENTOLIN HFA) 108 (90 BASE) MCG/ACT inhaler Inhale 1 puff into the lungs every 6 (six) hours as needed for wheezing or shortness of breath.   More than a month at Unknown time  . albuterol (PROVENTIL) (2.5 MG/3ML) 0.083% nebulizer solution Take 2.5 mg by nebulization every 6 (six) hours as needed for wheezing.    More than a month at Unknown time  . aspirin 81 MG EC tablet Take 81 mg by mouth daily.    11/22/2015 at Unknown time  . Blood Glucose Monitoring Suppl KIT Use device to check blood sugars 1-2 times daily. Please provide what is covered by insurance   11/23/2015 at Unknown time  . butalbital-acetaminophen-caffeine (FIORICET, ESGIC) 50-325-40 MG tablet TAKE 1 TO 2 CAPSULES EVERY 6 HOURS AS NEEDED FOR HEADACHE  3 11/23/2015 at 1300  .  cyclobenzaprine (FLEXERIL) 5 MG tablet Take 1 tablet (5 mg total) by mouth 3 (three) times daily as needed for muscle spasms. (Patient not taking: Reported on 11/23/2015) 30 tablet 0 Not Taking at Unknown time  . Docosahexaenoic Acid (PRENATAL DHA PO) Take 6 each by mouth daily. Gummies   11/22/2015 at Unknown time  . fluticasone (FLONASE) 50 MCG/ACT nasal spray Place 2 sprays into both nostrils daily. (Patient not taking: Reported on 11/23/2015) 16 g 3 More than a month at Unknown time  . glucose  blood test strip Use device to check blood sugars 1-2 times daily. Please provide what is covered by insurance   11/23/2015 at Unknown time  . glyBURIDE (DIABETA) 5 MG tablet Take 0.5 tablets (2.5 mg total) by mouth at bedtime. 45 tablet 3 11/22/2015 at Unknown time  . labetalol (NORMODYNE) 200 MG tablet Take 1 tablet (200 mg total) by mouth 3 (three) times daily. 60 tablet 1   . Lancets 28G MISC Use device to check blood sugars 1-2 times daily. Please provide what is covered by insurance   11/23/2015 at Unknown time  . metFORMIN (GLUCOPHAGE) 1000 MG tablet Take 1 tablet (1,000 mg total) by mouth daily. (Patient taking differently: Take 1,000 mg by mouth at bedtime. ) 90 tablet 3 11/22/2015 at Unknown time    ROSsee HPI Physical Exam   Blood pressure 132/90, pulse 98, temperature 97.8 F (36.6 C), temperature source Oral, resp. rate 18, last menstrual period 02/06/2015.  Physical Exam  Constitutional: She appears well-developed and well-nourished.  HENT:  Head: Normocephalic.  Eyes: Pupils are equal, round, and reactive to light.  Visual fields grossly normal  Cardiovascular: Normal rate and regular rhythm.  Respiratory: Effort normal and breath sounds normal.  GI: Soft.  Gravid uterus, consistent with dates, obese with pannus Transverse lower abdominal incison with pannus  Skin: Skin is warm and dry.  Psychiatric: She has a normal mood and affect. Her behavior is normal.  Diffusely anxious.    MAU Course  Procedures  MDM Hx review, prior chart,   Assessment and Plan  Pregnancy 36+4 Prior cesarean, declining tolac Preeclampsia with severe features, (headache @7 -9/10, vision blurry) Desire for permanent sterilization  For Repeat cesarean section and tubal ligation Mag sulfate started To OR at 16:30  Margaret Cabrera V 11/27/2015, 3:31 PM

## 2015-11-27 NOTE — Anesthesia Procedure Notes (Signed)
Spinal Patient location during procedure: OR Start time: 11/27/2015 4:38 PM Staffing Anesthesiologist: Sherrian Divers Performed by: anesthesiologist  Spinal Block Patient position: sitting Prep: DuraPrep Patient monitoring: heart rate, cardiac monitor, continuous pulse ox and blood pressure Approach: midline Location: L4-5 Injection technique: single-shot Needle Needle type: Pencan  Needle gauge: 24 G Needle length: 10 cm Assessment Sensory level: T4 Additional Notes Tolerated well.

## 2015-11-27 NOTE — Consult Note (Signed)
Neonatology Note:   Attendance at C-section:   I was asked by Dr. Ferguson to attend this repeat C/S at 36 4/7 weeks due to maternal pre-eclampsia with severe features. The mother is a [id number] O pos, GBS positive with Type 2 DM, on glyburide and metformin. Mother was given 2 doses of Betamethasone last week and got neuroprotective magnesium sulfate prior to delivery. ROM at delivery, fluid clear. Infant vigorous with good spontaneous cry and tone. Cord clamping was delayed about 45 seconds. Needed only minimal bulb suctioning. Ap 8/9. Lungs clear to ausc in DR, no distress. To CN to care of Pediatrician.  Melis Trochez C. Terris Bodin, MD 

## 2015-11-27 NOTE — Transfer of Care (Signed)
Immediate Anesthesia Transfer of Care Note  Patient: Margaret Cabrera  Procedure(s) Performed: Procedure(s): CESAREAN SECTION (N/A)  Patient Location: PACU  Anesthesia Type:Spinal  Level of Consciousness: awake, alert  and oriented  Airway & Oxygen Therapy: Patient Spontanous Breathing  Post-op Assessment: Report given to RN and Post -op Vital signs reviewed and stable  Post vital signs: Reviewed and stable  Last Vitals:  Filed Vitals:   11/27/15 1601 11/27/15 1615  BP: 139/81 133/80  Pulse: 100 99  Temp:  36.8 C  Resp:  20    Complications: No apparent anesthesia complications

## 2015-11-27 NOTE — MAU Provider Note (Signed)
History     CSN: [id number]  Arrival date and time: 11/27/15 1430  This [id number] is seen again at the MAU for symptoms of progressive headache, descirbed as severe with pain rated a 7-9, accompanied by vision changes that have developed over the last 2 days where she notes decreased visual acuity. The patient has had some chronic headache complaints x 4 wks, but this seems progressive, and with the visual complaints as well, I feel the benefit of delivery exceeds the desire to achieve 37 wk. I consider the progression to meed the criteria for severe features of preeclampsia. The dx of preE was confirmed earlier with proteinuria over 300 mg/d at recent hospitalization. The pt had BMZ administered last week. NICU is approving of delivery here. The patient has requested permanent sterilization at the time of cesarean, had mentioned this at prior MAU eval last Friday, and has been counselled over the permanency of requested procedure and its success rates. Filshie Clips are planned. Pt last ate at 9:30 am.     Chief Complaint  Patient presents with  . Hypertension   HPI    Past Medical History  Diagnosis Date  . [place] without complication ([place]  . Vaginal Pap smear, abnormal   . MTHFR mutation ([facility]  . PONV (postoperative nausea and vomiting)   . Headache   . Asthma     Past Surgical History  Procedure Laterality Date  . Tonsillectomy  1987  . Cesarean section  03-2013  . Cryotherapy    . Dilation and curettage of uterus  09-2007  . Dilation and curettage of uterus  11-2011  . Cholecystectomy  1997  . Urethra stretched      Family History  Problem Relation Age of Onset  . Cancer Father   . Hypertension Father   . Heart disease Father   . Diabetes Father   . Stroke Brother   . Diabetes Brother   . Stroke Maternal Grandmother     Social History  Substance Use Topics  . Smoking status: Never Smoker   . Smokeless tobacco: None  .  Alcohol Use: No    Allergies:  Allergies  Allergen Reactions  . Prednisone Rash  . Codeine Nausea Only  . Sulfa Antibiotics Rash    Prescriptions prior to admission  Medication Sig Dispense Refill Last Dose  . acetaminophen (TYLENOL) 325 MG tablet Take 650 mg by mouth every 6 (six) hours as needed for headache.   11/23/2015 at 1300  . albuterol (PROVENTIL HFA;VENTOLIN HFA) 108 (90 BASE) MCG/ACT inhaler Inhale 1 puff into the lungs every 6 (six) hours as needed for wheezing or shortness of breath.   More than a month at Unknown time  . albuterol (PROVENTIL) (2.5 MG/3ML) 0.083% nebulizer solution Take 2.5 mg by nebulization every 6 (six) hours as needed for wheezing.    More than a month at Unknown time  . aspirin 81 MG EC tablet Take 81 mg by mouth daily.    11/22/2015 at Unknown time  . Blood Glucose Monitoring Suppl KIT Use device to check blood sugars 1-2 times daily. Please provide what is covered by insurance   11/23/2015 at Unknown time  . butalbital-acetaminophen-caffeine (FIORICET, ESGIC) 50-325-40 MG tablet TAKE 1 TO 2 CAPSULES EVERY 6 HOURS AS NEEDED FOR HEADACHE  3 11/23/2015 at 1300  . cyclobenzaprine (FLEXERIL) 5 MG tablet Take 1 tablet (5 mg total) by mouth 3 (three) times daily as needed for muscle spasms. (  Patient not taking: Reported on 11/23/2015) 30 tablet 0 Not Taking at Unknown time  . Docosahexaenoic Acid (PRENATAL DHA PO) Take 6 each by mouth daily. Gummies   11/22/2015 at Unknown time  . fluticasone (FLONASE) 50 MCG/ACT nasal spray Place 2 sprays into both nostrils daily. (Patient not taking: Reported on 11/23/2015) 16 g 3 More than a month at Unknown time  . glucose blood test strip Use device to check blood sugars 1-2 times daily. Please provide what is covered by insurance   11/23/2015 at Unknown time  . glyBURIDE (DIABETA) 5 MG tablet Take 0.5 tablets (2.5 mg total) by mouth at bedtime. 45 tablet 3 11/22/2015 at Unknown time  . labetalol (NORMODYNE) 200 MG tablet Take 1  tablet (200 mg total) by mouth 3 (three) times daily. 60 tablet 1   . Lancets 28G MISC Use device to check blood sugars 1-2 times daily. Please provide what is covered by insurance   11/23/2015 at Unknown time  . metFORMIN (GLUCOPHAGE) 1000 MG tablet Take 1 tablet (1,000 mg total) by mouth daily. (Patient taking differently: Take 1,000 mg by mouth at bedtime. ) 90 tablet 3 11/22/2015 at Unknown time    ROSsee  HPI Physical Exam   Blood pressure 132/90, pulse 98, temperature 97.8 F (36.6 C), temperature source Oral, resp. rate 18, last menstrual period 02/06/2015.  Physical Exam  Constitutional: She appears well-developed and well-nourished.  HENT:  Head: Normocephalic.  Eyes: Pupils are equal, round, and reactive to light.  Visual fields grossly normal  Cardiovascular: Normal rate and regular rhythm.   Respiratory: Effort normal and breath sounds normal.  GI: Soft.  Gravid uterus, consistent with dates, obese with pannus Transverse lower abdominal incison with pannus  Skin: Skin is warm and dry.  Psychiatric: She has a normal mood and affect. Her behavior is normal.  Diffusely anxious.    MAU Course  Procedures  MDM Hx review, prior chart,   Assessment and Plan  Pregnancy 36+4 Prior cesarean, declining tolac Preeclampsia with severe features, (headache @7 -9/10, vision blurry) Desire for permanent sterilization  For Repeat cesarean section and tubal ligation Mag sulfate started To OR at 16:30  Jayd Forrey V 11/27/2015, 3:31 PM

## 2015-11-27 NOTE — MAU Note (Signed)
Pt states she has been having a headache that has been going on for four weeks.  Pt states she was here on Wednesday and was admitted because of elevated BPs.  Pt states she was discharged on Friday and told to come back if things got worse.  Pt came back Friday night because of worsening symptoms.  Pt states Dr. Emelda Fear told them to contact her with any new issues.  Pt states yesterday her BP was 162/100.  Pt states she called Dr. Emelda Fear today and was told to come in today.  Pt states she has a scheduled c-section on 11/30/15.  Pt has not had anything to eat or drink since 0930 this morning.

## 2015-11-28 ENCOUNTER — Encounter: Payer: 59 | Admitting: Obstetrics & Gynecology

## 2015-11-28 ENCOUNTER — Encounter (HOSPITAL_COMMUNITY): Payer: Self-pay | Admitting: Certified Registered Nurse Anesthetist

## 2015-11-28 ENCOUNTER — Encounter (HOSPITAL_COMMUNITY): Payer: Self-pay | Admitting: Obstetrics and Gynecology

## 2015-11-28 LAB — COMPREHENSIVE METABOLIC PANEL
ALBUMIN: 2.5 g/dL — AB (ref 3.5–5.0)
ALT: 9 U/L — ABNORMAL LOW (ref 14–54)
ANION GAP: 8 (ref 5–15)
AST: 17 U/L (ref 15–41)
Alkaline Phosphatase: 66 U/L (ref 38–126)
BILIRUBIN TOTAL: 0.5 mg/dL (ref 0.3–1.2)
BUN: 14 mg/dL (ref 6–20)
CHLORIDE: 104 mmol/L (ref 101–111)
CO2: 21 mmol/L — AB (ref 22–32)
Calcium: 7.3 mg/dL — ABNORMAL LOW (ref 8.9–10.3)
Creatinine, Ser: 0.63 mg/dL (ref 0.44–1.00)
GFR calc Af Amer: >60 mL/min (ref >60)
GLUCOSE: 106 mg/dL — AB (ref 65–99)
POTASSIUM: 3.9 mmol/L (ref 3.5–5.1)
Sodium: 133 mmol/L — ABNORMAL LOW (ref 135–145)
Total Protein: 5.9 g/dL — ABNORMAL LOW (ref 6.5–8.1)

## 2015-11-28 LAB — CBC
HEMATOCRIT: 33.3 % — AB (ref 36.0–46.0)
HEMOGLOBIN: 10.9 g/dL — AB (ref 12.0–15.0)
MCH: 28.7 pg (ref 26.0–34.0)
MCHC: 32.7 g/dL (ref 30.0–36.0)
MCV: 87.6 fL (ref 78.0–100.0)
Platelets: 241 10*3/uL (ref 150–400)
RBC: 3.8 MIL/uL — AB (ref 3.87–5.11)
RDW: 15.3 % (ref 11.5–15.5)
WBC: 10.3 10*3/uL (ref 4.0–10.5)

## 2015-11-28 LAB — GLUCOSE, CAPILLARY
GLUCOSE-CAPILLARY: 95 mg/dL (ref 65–99)
GLUCOSE-CAPILLARY: 109 mg/dL — AB (ref 65–99)
GLUCOSE-CAPILLARY: 114 mg/dL — AB (ref 65–99)
Glucose-Capillary: 94 mg/dL (ref 65–99)

## 2015-11-28 MED ORDER — METFORMIN HCL ER 500 MG PO TB24
1000.0000 mg | ORAL_TABLET | Freq: Every day | ORAL | Status: DC
  Filled 2015-11-28: qty 2

## 2015-11-28 MED ORDER — METFORMIN HCL ER 500 MG PO TB24
1000.0000 mg | ORAL_TABLET | Freq: Every day | ORAL | Status: DC
  Administered 2015-11-28: 1000 mg via ORAL
  Filled 2015-11-28 (×2): qty 2

## 2015-11-28 NOTE — Lactation Note (Signed)
This note was copied from a baby's chart. Lactation Consultation Note  Initial visit made.  Baby was born at 36.4 weeks and she is currently in the CN with some possible grunting.  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  She was unable to breastfeed her first baby due to difficult latch and low milk supply.  Mom did pump for a few months but states milk dried up.  She voices disappointment that first experience was not successful.  Breastfeeding consultation services and support and late preterm handouts given to mom.  Discussed the possibility of a LPT baby needing time before feeds are effective.  Mom states baby latched well during the night and she also supplementing with expressed milk/formula due to hunger cues.  Instructed to continue putting baby to breast with cues, post pump and hand express every 3 hours and give expressed milk back to baby with spoon.  Encouraged to call with concerns or assist prn.  Patient Name: Girl Emilynn Srinivasan [id number] Date: 11/28/2015 Reason for consult: Initial assessment;Late preterm infant   Maternal Data Has patient been taught Hand Expression?: Yes Does the patient have breastfeeding experience prior to this delivery?: Yes  Feeding    LATCH Score/Interventions Latch: Repeated attempts needed to sustain latch, nipple held in mouth throughout feeding, stimulation needed to elicit sucking reflex. Intervention(s): Skin to skin Intervention(s): Breast compression;Adjust position  Audible Swallowing: None Intervention(s): Skin to skin  Type of Nipple: Everted at rest and after stimulation  Comfort (Breast/Nipple): Soft / non-tender     Hold (Positioning): Assistance needed to correctly position infant at breast and maintain latch. Intervention(s): Support Pillows;Position options  LATCH Score: 6  Lactation Tools Discussed/Used Pump Review: Setup, frequency, and cleaning;Milk Storage Initiated by:: RN Date initiated:: 11/27/15   Consult  Status Consult Status: Follow-up Date: 11/29/15 Follow-up type: In-patient    Huston Foley 11/28/2015, 9:28 AM

## 2015-11-28 NOTE — Progress Notes (Signed)
Report called to Sh[facility] B[facility] on [facility]. Pt transferring to room 101.

## 2015-11-28 NOTE — Progress Notes (Signed)
Subjective: Postpartum Day 1: Cesarean Delivery and BTL for preE with severe features, H/A,  Patient reports feeling significantly better, headache dramatically better.    Objective: Vital signs in last 24 hours: Temp:  [97.4 F (36.3 C)-98.3 F (36.8 C)] 97.9 F (36.6 C) (02/20 0502) Pulse Rate:  [76-112] 87 (02/20 0602) Resp:  [16-23] 18 (02/20 0502) BP: (106-139)/(55-90) 117/74 mmHg (02/20 0602) SpO2:  [95 %-99 %] 98 % (02/20 0402) Weight:  [119.75 kg (264 lb)] 119.75 kg (264 lb) (02/20 0402)  Physical Exam:  General: alert and cooperative Lochia: appropriate Uterine Fundus: firm Incision: healing well, no significant drainage, no dehiscence, no significant erythema DVT Evaluation: No evidence of DVT seen on physical exam. Reflexes 1+  Recent Labs  11/27/15 1520 11/28/15 0502  HGB 12.5 10.9*  HCT 37.0 33.3*    Assessment/Plan: Status post Cesarean section. Doing well postoperatively.  DMA-2 so will check cbg's x 4 today.fasting and PC x3 D/c Mag sulfate  At 5 pm.  Fardeen Steinberger V 11/28/2015, 7:26 AM

## 2015-11-28 NOTE — Addendum Note (Signed)
Addendum  created 11/28/15 1255 by Elgie Congo, CRNA   Modules edited: Clinical Notes   Clinical Notes:  File: [id number]

## 2015-11-28 NOTE — Anesthesia Postprocedure Evaluation (Signed)
Anesthesia Post Note  Patient: Margaret Cabrera  Procedure(s) Performed: Procedure(s) (LRB): CESAREAN SECTION (N/A)  Patient location during evaluation: Antenatal Anesthesia Type: Spinal Level of consciousness: awake Pain management: pain level controlled Vital Signs Assessment: post-procedure vital signs reviewed and stable Respiratory status: spontaneous breathing Cardiovascular status: stable Postop Assessment: no signs of nausea or vomiting and adequate PO intake Anesthetic complications: no    Last Vitals:  Filed Vitals:   11/28/15 1002 11/28/15 1102  BP: 125/67 127/67  Pulse: 80 88  Temp:  36.9 C  Resp: 16 16    Last Pain:  Filed Vitals:   11/28/15 1141  PainSc: 2                  Lynann Demetrius Hristova

## 2015-11-29 ENCOUNTER — Encounter: Payer: Self-pay | Admitting: Obstetrics & Gynecology

## 2015-11-29 DIAGNOSIS — O99820 Streptococcus B carrier state complicating pregnancy: Secondary | ICD-10-CM | POA: Insufficient documentation

## 2015-11-29 LAB — BIRTH TISSUE RECOVERY COLLECTION (PLACENTA DONATION)

## 2015-11-29 MED ORDER — METFORMIN HCL ER (OSM) 1000 MG PO TB24: 1000.0000 mg | ORAL_TABLET | Freq: Every day | ORAL | Status: DC

## 2015-11-29 MED ORDER — SENNOSIDES-DOCUSATE SODIUM 8.6-50 MG PO TABS: 1.0000 | ORAL_TABLET | Freq: Every evening | ORAL | Status: AC | PRN

## 2015-11-29 MED ORDER — OXYCODONE-ACETAMINOPHEN 5-325 MG PO TABS: 1.0000 | ORAL_TABLET | Freq: Four times a day (QID) | ORAL | Status: AC | PRN

## 2015-11-29 NOTE — Discharge Instructions (Signed)
Check your blood pressure once a day for the next two weeks. Contact the clinic for systolic pressure 150 or greater, or for diastolic pressure 100 or greater, of if you develop any symptoms of preeclampsia (discussed below).     Preeclampsia and Eclampsia Preeclampsia is a serious condition that develops only during pregnancy. It is also called toxemia of pregnancy. This condition causes high blood pressure along with other symptoms, such as swelling and headaches. These may develop as the condition gets worse. Preeclampsia may occur 20 weeks or later into your pregnancy.  Diagnosing and treating preeclampsia early is very important. If not treated early, it can cause serious problems for you and your baby. One problem it can lead to is eclampsia, which is a condition that causes muscle jerking or shaking (convulsions) in the mother. Delivering your baby is the best treatment for preeclampsia or eclampsia.  RISK FACTORS The cause of preeclampsia is not known. You may be more likely to develop preeclampsia if you have certain risk factors. These include:   Being pregnant for the first time.  Having preeclampsia in a past pregnancy.  Having a family history of preeclampsia.  Having high blood pressure.  Being pregnant with twins or triplets.  Being 45 or older.  Being African American.  Having kidney disease or diabetes.  Having medical conditions such as lupus or blood diseases.  Being very overweight (obese). SIGNS AND SYMPTOMS  The earliest signs of preeclampsia are:  High blood pressure.  Increased protein in your urine. Your health care provider will check for this at every prenatal visit. Other symptoms that can develop include:   Severe headaches.  Sudden weight gain.  Swelling of your hands, face, legs, and feet.  Feeling sick to your stomach (nauseous) and throwing up (vomiting).  Vision problems (blurred or double vision).  Numbness in your face, arms, legs,  and feet.  Dizziness.  Slurred speech.  Sensitivity to bright lights.  Abdominal pain. DIAGNOSIS  There are no screening tests for preeclampsia. Your health care provider will ask you about symptoms and check for signs of preeclampsia during your prenatal visits. You may also have tests, including:  Urine testing.  Blood testing.  Checking your baby's heart rate.  Checking the health of your baby and your placenta using images created with sound waves (ultrasound). TREATMENT  You can work out the best treatment approach together with your health care provider. It is very important to keep all prenatal appointments. If you have an increased risk of preeclampsia, you may need more frequent prenatal exams.  Your health care provider may prescribe bed rest.  You may have to eat as little salt as possible.  You may need to take medicine to lower your blood pressure if the condition does not respond to more conservative measures.  You may need to stay in the hospital if your condition is severe. There, treatment will focus on controlling your blood pressure and fluid retention. You may also need to take medicine to prevent seizures.  If the condition gets worse, your baby may need to be delivered early to protect you and the baby. You may have your labor started with medicine (be induced), or you may have a cesarean delivery.  Preeclampsia usually goes away after the baby is born. HOME CARE INSTRUCTIONS   Only take over-the-counter or prescription medicines as directed by your health care provider.  Lie on your left side while resting. This keeps pressure off your baby.  Elevate your  feet while resting.  Get regular exercise. Ask your health care provider what type of exercise is safe for you.  Avoid caffeine and alcohol.  Do not smoke.  Drink 6-8 glasses of water every day.  Eat a balanced diet that is low in salt. Do not add salt to your food.  Avoid stressful  situations as much as possible.  Get plenty of rest and sleep.  Keep all prenatal appointments and tests as scheduled. SEEK MEDICAL CARE IF:  You are gaining more weight than expected.  You have any headaches, abdominal pain, or nausea.  You are bruising more than usual.  You feel dizzy or light-headed. SEEK IMMEDIATE MEDICAL CARE IF:   You develop sudden or severe swelling anywhere in your body. This usually happens in the legs.  You gain 5 lb (2.3 kg) or more in a week.  You have a severe headache, dizziness, problems with your vision, or confusion.  You have severe abdominal pain.  You have lasting nausea or vomiting.  You have a seizure.  You have trouble moving any part of your body.  You develop numbness in your body.  You have trouble speaking.  You have any abnormal bleeding.  You develop a stiff neck.  You pass out. MAKE SURE YOU:   Understand these instructions.  Will watch your condition.  Will get help right away if you are not doing well or get worse.   This information is not intended to replace advice given to you by your health care provider. Make sure you discuss any questions you have with your health care provider.   Document Released: 09/21/2000 Document Revised: 09/29/2013 Document Reviewed: 07/17/2013 Elsevier Interactive Patient Education 2016 [employer] Cesarean Delivery, Care After Refer to this sheet in the next few weeks. These instructions provide you with information on caring for yourself after your procedure. Your health care provider may also give you specific instructions. Your treatment has been planned according to current medical practices, but problems sometimes occur. Call your health care provider if you have any problems or questions after you go home. HOME CARE INSTRUCTIONS  Only take over-the-counter or prescription medications as directed by your health care provider.  Do not drink alcohol, especially if you are  breastfeeding or taking medication to relieve pain.  Do not chew or smoke tobacco.  Continue to use good perineal care. Good perineal care includes:  Wiping your perineum from front to back.  Keeping your perineum clean.  Check your surgical cut (incision) daily for increased redness, drainage, swelling, or separation of skin.  Clean your incision gently with soap and water every day, and then pat it dry. If your health care provider says it is okay, leave the incision uncovered. Use a bandage (dressing) if the incision is draining fluid or appears irritated. If the adhesive strips across the incision do not fall off within 7 days, carefully peel them off.  Hug a pillow when coughing or sneezing until your incision is healed. This helps to relieve pain.  Do not use tampons or douche until your health care provider says it is okay.  Shower, wash your hair, and take tub baths as directed by your health care provider.  Wear a well-fitting bra that provides breast support.  Limit wearing support panties or control-top hose.  Drink enough fluids to keep your urine clear or pale yellow.  Eat high-fiber foods such as whole grain cereals and breads, brown rice, beans, and fresh fruits and vegetables every  day. These foods may help prevent or relieve constipation.  Resume activities such as climbing stairs, driving, lifting, exercising, or traveling as directed by your health care provider.  Talk to your health care provider about resuming sexual activities. This is dependent upon your risk of infection, your rate of healing, and your comfort and desire to resume sexual activity.  Try to have someone help you with your household activities and your newborn for at least a few days after you leave the hospital.  Rest as much as possible. Try to rest or take a nap when your newborn is sleeping.  Increase your activities gradually.  Keep all of your scheduled postpartum appointments. It is  very important to keep your scheduled follow-up appointments. At these appointments, your health care provider will be checking to make sure that you are healing physically and emotionally. SEEK MEDICAL CARE IF:   You are passing large clots from your vagina. Save any clots to show your health care provider.  You have a foul smelling discharge from your vagina.  You have trouble urinating.  You are urinating frequently.  You have pain when you urinate.  You have a change in your bowel movements.  You have increasing redness, pain, or swelling near your incision.  You have pus draining from your incision.  Your incision is separating.  You have painful, hard, or reddened breasts.  You have a severe headache.  You have blurred vision or see spots.  You feel sad or depressed.  You have thoughts of hurting yourself or your newborn.  You have questions about your care, the care of your newborn, or medications.  You are dizzy or light-headed.  You have a rash.  You have pain, redness, or swelling at the site of the removed intravenous access (IV) tube.  You have nausea or vomiting.  You stopped breastfeeding and have not had a menstrual period within 12 weeks of stopping.  You are not breastfeeding and have not had a menstrual period within 12 weeks of delivery.  You have a fever. SEEK IMMEDIATE MEDICAL CARE IF:  You have persistent pain.  You have chest pain.  You have shortness of breath.  You faint.  You have leg pain.  You have stomach pain.  Your vaginal bleeding saturates 2 or more sanitary pads in 1 hour. MAKE SURE YOU:   Understand these instructions.  Will watch your condition.  Will get help right away if you are not doing well or get worse.   This information is not intended to replace advice given to you by your health care provider. Make sure you discuss any questions you have with your health care provider.   Document Released: 06/16/2002  Document Revised: 10/15/2014 Document Reviewed: 05/21/2012 Elsevier Interactive Patient Education [employer]

## 2015-11-29 NOTE — Progress Notes (Signed)
UR chart review completed.  

## 2015-11-29 NOTE — Discharge Summary (Signed)
OB Discharge Summary     Patient Name: Margaret Cabrera DOB: [birth year] MRN: [id number]  Date of admission: 11/27/2015 Delivering MD: [name]   Date of discharge: 11/29/2015  Admitting diagnosis: 36.4 WKS, HEADACHE, BP Intrauterine pregnancy: [redacted]w[redacted]d    Secondary diagnosis:  Active Problems:   Preeclampsia   S/P cesarean section preeclampsia with severe features pregestational diabetes S/p postpartum tubal ligation  Additional problems: none     Discharge diagnosis: Preterm Pregnancy Delivered, Preeclampsia (severe) and Type 2 DM; postpartum tubal ligation                                                               Post partum procedures:24 hours PP Mg  Augmentation: n/a  Complications: None  Hospital course:  Repeat c/s   35y.o. yo [id number] admitted to the hospital 11/27/2015 for repeat cesarean section with the following indication: preeclampsia with severe features. Membrane Rupture Time/Date: 5:01 PM ,11/27/2015   Patient delivered a Viable infant.11/27/2015  Details of operation can be found in separate operative note.  Pateint had an uncomplicated postpartum course.  She is ambulating, tolerating a regular diet, passing flatus, and urinating well. Patient is discharged home in stable condition on  11/29/2015        Preeclampsia: treated with 24 hours PP Mg. Off magnesium for over 12 hrs BPs wnl and asymptomatic. Patient is a [employer] declines coming in for BP check; she says she has an automated cuff at home and wants to use that. I told her to check her bp daily for the next 2 weeks and to present to clinic for systolic 15224r greater or diastolic 10825r greater or for symptoms of preeclampsia.  Patient pregestational diabetic not on meds prior to pregnancy; taking glyburide and metformin during pregnancy. Metformin will be continued postpartum.   Physical exam  Filed Vitals:   11/28/15 1800 11/28/15 1818 11/28/15 2200 11/29/15 0626  BP: 128/68  130/71 139/69 114/68  Pulse: 77 91 79 73  Temp:  98.7 F (37.1 C) 98.5 F (36.9 C) 98.1 F (36.7 C)  TempSrc:  Oral Oral Oral  Resp:  18 18 18   Height:      Weight:      SpO2:  97% 100%    General: alert, cooperative and no distress Lochia: appropriate Uterine Fundus: firm Incision: Healing well with no significant drainage, No significant erythema, Dressing is clean, dry, and intact DVT Evaluation: No cords or calf tenderness. No significant calf/ankle edema. Labs: Lab Results  Component Value Date   WBC 10.3 11/28/2015   HGB 10.9* 11/28/2015   HCT 33.3* 11/28/2015   MCV 87.6 11/28/2015   PLT 241 11/28/2015   CMP Latest Ref Rng 11/28/2015  Glucose 65 - 99 mg/dL 106(H)  BUN 6 - 20 mg/dL 14  Creatinine 0.44 - 1.00 mg/dL 0.63  Sodium 135 - 145 mmol/L 133(L)  Potassium 3.5 - 5.1 mmol/L 3.9  Chloride 101 - 111 mmol/L 104  CO2 22 - 32 mmol/L 21(L)  Calcium 8.9 - 10.3 mg/dL 7.3(L)  Total Protein 6.5 - 8.1 g/dL 5.9(L)  Total Bilirubin 0.3 - 1.2 mg/dL 0.5  Alkaline Phos 38 - 126 U/L 66  AST 15 - 41 U/L 17  ALT 14 -  54 U/L 9(L)    Discharge instruction: per After Visit Summary and "Baby and Me Booklet".  After visit meds:    Medication List    STOP taking these medications        aspirin 81 MG EC tablet     Blood Glucose Monitoring Suppl Kit     glucose blood test strip     glyBURIDE 5 MG tablet  Commonly known as:  DIABETA     labetalol 200 MG tablet  Commonly known as:  NORMODYNE     Lancets 28G Misc     metFORMIN 1000 MG tablet  Commonly known as:  GLUCOPHAGE  Replaced by:  metformin 1000 MG (OSM) 24 hr tablet     PRENATAL DHA PO      TAKE these medications        acetaminophen 325 MG tablet  Commonly known as:  TYLENOL  Take 650 mg by mouth every 6 (six) hours as needed for headache.     albuterol (2.5 MG/3ML) 0.083% nebulizer solution  Commonly known as:  PROVENTIL  Take 2.5 mg by nebulization every 6 (six) hours as needed for wheezing.      albuterol 108 (90 Base) MCG/ACT inhaler  Commonly known as:  PROVENTIL HFA;VENTOLIN HFA  Inhale 1 puff into the lungs every 6 (six) hours as needed for wheezing or shortness of breath.     butalbital-acetaminophen-caffeine 50-325-40 MG tablet  Commonly known as:  FIORICET, ESGIC  TAKE 1 TO 2 CAPSULES EVERY 6 HOURS AS NEEDED FOR HEADACHE     metformin 1000 MG (OSM) 24 hr tablet  Commonly known as:  FORTAMET  Take 1 tablet (1,000 mg total) by mouth at bedtime.     senna-docusate 8.6-50 MG tablet  Commonly known as:  Senokot-S  Take 1 tablet by mouth at bedtime as needed for mild constipation.        Diet: routine diet  Activity: Advance as tolerated. Pelvic rest for 6 weeks.   Outpatient follow up:2 weeks Follow up Appt:Future Appointments Date Time Provider [place]  12/15/2015 9:00 AM Lavonia Drafts, MD [facility] None   Follow up Visit:No Follow-up on file.  Postpartum contraception: Tubal Ligation  Newborn Data: Live born female  Birth Weight: 7 lb 7.2 oz (3380 g) APGAR: 8, 9  Baby Feeding: Bottle and Breast Disposition:home with mother   11/29/2015 Desma Maxim, MD

## 2015-11-30 ENCOUNTER — Encounter (HOSPITAL_COMMUNITY): Admission: RE | Payer: Self-pay | Source: Ambulatory Visit

## 2015-11-30 ENCOUNTER — Inpatient Hospital Stay (HOSPITAL_COMMUNITY): Admission: RE | Admit: 2015-11-30 | Payer: 59 | Source: Ambulatory Visit | Admitting: Obstetrics & Gynecology

## 2015-11-30 SURGERY — Surgical Case
Anesthesia: Regional | Site: Abdomen | Laterality: Bilateral

## 2015-12-01 ENCOUNTER — Other Ambulatory Visit: Payer: Managed Care, Other (non HMO)

## 2015-12-05 ENCOUNTER — Encounter: Payer: 59 | Admitting: Obstetrics & Gynecology

## 2015-12-08 ENCOUNTER — Other Ambulatory Visit: Payer: 59

## 2015-12-12 ENCOUNTER — Encounter: Payer: 59 | Admitting: Obstetrics & Gynecology

## 2015-12-15 ENCOUNTER — Ambulatory Visit (INDEPENDENT_AMBULATORY_CARE_PROVIDER_SITE_OTHER): Payer: 59 | Admitting: Obstetrics & Gynecology

## 2015-12-15 ENCOUNTER — Encounter: Payer: Self-pay | Admitting: Obstetrics & Gynecology

## 2015-12-15 ENCOUNTER — Ambulatory Visit: Payer: 59 | Admitting: Obstetrics & Gynecology

## 2015-12-15 VITALS — BP 144/72 | HR 98 | Wt 245.0 lb

## 2015-12-15 DIAGNOSIS — Z9889 Other specified postprocedural states: Secondary | ICD-10-CM

## 2015-12-15 DIAGNOSIS — E11638 Type 2 diabetes mellitus with other oral complications: Secondary | ICD-10-CM

## 2015-12-15 NOTE — Progress Notes (Signed)
Patient ID: Margaret Cabrera, female   DOB: 06-20-1981, 35 y.o.   MRN: [id number] History:  35 y.o. [id number] here today for 2 week post op check.  She reports some elevated BP's.  Otherwise she feels much better than when she was pregnant. Her FSG has been all less than 100. She reports some redness of the right edge of her incision.  No abnormal pain.  O drainage from incision.   The following portions of the patient's history were reviewed and updated as appropriate: allergies, current medications, past family history, past medical history, past social history, past surgical history and problem list.  Past Medical History  Diagnosis Date  . Diabetes mellitus without complication (HCC)   . Vaginal Pap smear, abnormal   . MTHFR mutation (HCC)   . PONV (postoperative nausea and vomiting)   . Headache   . Asthma    Review of Systems:  Pertinent items are noted in HPI.  Objective:  Physical Exam Blood pressure 144/72, pulse 98, weight 245 lb (111.131 kg), unknown if currently breastfeeding. Gen: NAD Abd: Soft, nontender and nondistended; incision clean, dry and intact Pelvic: deferred   Assessment & Plan:  2 week post op check doing well Type 2 DM (Class B GDM) F/u in 4 weeks or sooner prn  Increase Metformin to 2000 q day  Persais Ethridge L. Harraway-Smith, M.D., Evern CoreFACOG

## 2015-12-15 NOTE — Patient Instructions (Signed)
Cesarean Delivery, Care After  Refer to this sheet in the next few weeks. These instructions provide you with information on caring for yourself after your procedure. Your health care provider may also give you specific instructions. Your treatment has been planned according to current medical practices, but problems sometimes occur. Call your health care provider if you have any problems or questions after you go home.  HOME CARE INSTRUCTIONS   Only take over-the-counter or prescription medications as directed by your health care provider.   Do not drink alcohol, especially if you are breastfeeding or taking medication to relieve pain.   Do not chew or smoke tobacco.   Continue to use good perineal care. Good perineal care includes:    Wiping your perineum from front to back.    Keeping your perineum clean.   Check your surgical cut (incision) daily for increased redness, drainage, swelling, or separation of skin.   Clean your incision gently with soap and water every day, and then pat it dry. If your health care provider says it is okay, leave the incision uncovered. Use a bandage (dressing) if the incision is draining fluid or appears irritated. If the adhesive strips across the incision do not fall off within 7 days, carefully peel them off.   Hug a pillow when coughing or sneezing until your incision is healed. This helps to relieve pain.   Do not use tampons or douche until your health care provider says it is okay.   Shower, wash your hair, and take tub baths as directed by your health care provider.   Wear a well-fitting bra that provides breast support.   Limit wearing support panties or control-top hose.   Drink enough fluids to keep your urine clear or pale yellow.   Eat high-fiber foods such as whole grain cereals and breads, brown rice, beans, and fresh fruits and vegetables every day. These foods may help prevent or relieve constipation.   Resume activities such as climbing stairs,  driving, lifting, exercising, or traveling as directed by your health care provider.   Talk to your health care provider about resuming sexual activities. This is dependent upon your risk of infection, your rate of healing, and your comfort and desire to resume sexual activity.   Try to have someone help you with your household activities and your newborn for at least a few days after you leave the hospital.   Rest as much as possible. Try to rest or take a nap when your newborn is sleeping.   Increase your activities gradually.   Keep all of your scheduled postpartum appointments. It is very important to keep your scheduled follow-up appointments. At these appointments, your health care provider will be checking to make sure that you are healing physically and emotionally.  SEEK MEDICAL CARE IF:    You are passing large clots from your vagina. Save any clots to show your health care provider.   You have a foul smelling discharge from your vagina.   You have trouble urinating.   You are urinating frequently.   You have pain when you urinate.   You have a change in your bowel movements.   You have increasing redness, pain, or swelling near your incision.   You have pus draining from your incision.   Your incision is separating.   You have painful, hard, or reddened breasts.   You have a severe headache.   You have blurred vision or see spots.   You feel sad   or depressed.   You have thoughts of hurting yourself or your newborn.   You have questions about your care, the care of your newborn, or medications.   You are dizzy or light-headed.   You have a rash.   You have pain, redness, or swelling at the site of the removed intravenous access (IV) tube.   You have nausea or vomiting.   You stopped breastfeeding and have not had a menstrual period within 12 weeks of stopping.   You are not breastfeeding and have not had a menstrual period within 12 weeks of delivery.   You have a fever.  SEEK  IMMEDIATE MEDICAL CARE IF:   You have persistent pain.   You have chest pain.   You have shortness of breath.   You faint.   You have leg pain.   You have stomach pain.   Your vaginal bleeding saturates 2 or more sanitary pads in 1 hour.  MAKE SURE YOU:    Understand these instructions.   Will watch your condition.   Will get help right away if you are not doing well or get worse.     This information is not intended to replace advice given to you by your health care provider. Make sure you discuss any questions you have with your health care provider.     Document Released: 06/16/2002 Document Revised: 10/15/2014 Document Reviewed: 05/21/2012  Elsevier Interactive Patient Education 2016 [employer]

## 2016-01-12 ENCOUNTER — Encounter: Payer: Self-pay | Admitting: Family Medicine

## 2016-01-12 ENCOUNTER — Ambulatory Visit (INDEPENDENT_AMBULATORY_CARE_PROVIDER_SITE_OTHER): Payer: 59 | Admitting: Family Medicine

## 2016-01-12 NOTE — Progress Notes (Signed)
  Subjective:     Margaret Cabrera is a 35 y.o. female who presents for a postpartum visit. She is 6 weeks postpartum following a low cervical transverse Cesarean section. I have fully reviewed the prenatal and intrapartum course. The delivery was at 36 gestational weeks. Outcome: primary cesarean section, low transverse incision. Anesthesia: spinal. Postpartum course has been normal. Baby's course has been normal. Baby is feeding by breast. Bleeding no bleeding. Bowel function is normal. Bladder function is normal. Patient is not sexually active. Contraception method is tubal ligation. Postpartum depression screening: negative.  The following portions of the patient's history were reviewed and updated as appropriate: allergies, current medications, past family history, past medical history, past social history, past surgical history and problem list.  Review of Systems Pertinent items are noted in HPI.   Objective:    BP 127/76 mmHg  Pulse 95  Ht 5' 5.5" (1.664 m)  Wt 255 lb (115.667 kg)  BMI 41.77 kg/m2  General:  alert, cooperative and no distress     Lungs: clear to auscultation bilaterally  Heart:  regular rate and rhythm, S1, S2 normal, no murmur, click, rub or gallop  Abdomen: soft, non-tender; bowel sounds normal; no masses,  no organomegaly and incision clean, dry, intact.  well healing.                          Assessment:     Normal postpartum exam.  Type 2 diabetes, HTN Pap smear not done at today's visit.   Plan:    1. Contraception: tubal ligation 2. Continue Metformin.  Follow up with PCP for continued DM2 care. 3.  BP controlled off meds 3. Follow up in: 1 year or as needed.

## 2016-12-20 IMAGING — MR MR ABDOMEN W/O CM
6 of 11 series · 19 of 48 positions shown · non-contrast
Comparison: Ultrasound pelvis 06/17/2015

CLINICAL DATA: 33-year-old woman, 18 weeks pregnant. Complaint of
new in constant lower mid abdominal pain with onset around [DATE]
p.m.. Increased nausea, legs, and low back pain. History of
diabetes.

EXAM:
MRI ABDOMEN WITHOUT CONTRAST
TECHNIQUE: Multiplanar multisequence MR imaging was performed without the
administration of intravenous contrast.

[Series 4: T2 · coronal · 6.0mm · 0.86mm/px · 2 of 35 slices shown]
[im 1/35]
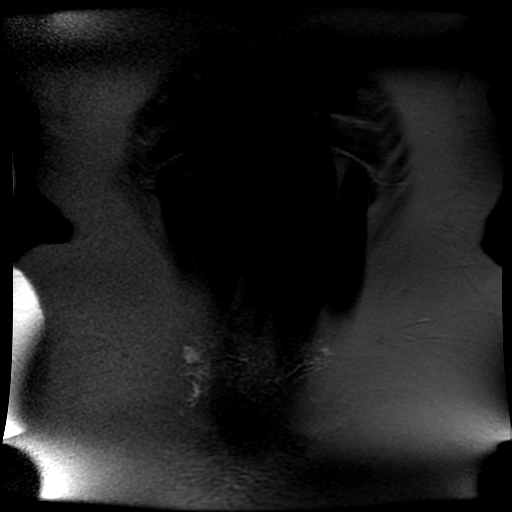
[im 35/35]
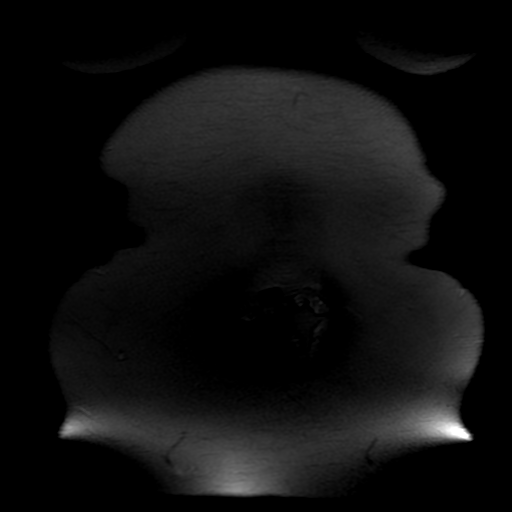

[Series 5: T2 fat-sat · coronal · 6.0mm · 0.86mm/px · 2 of 35 slices shown (1 of 2)]
[im 1/35]
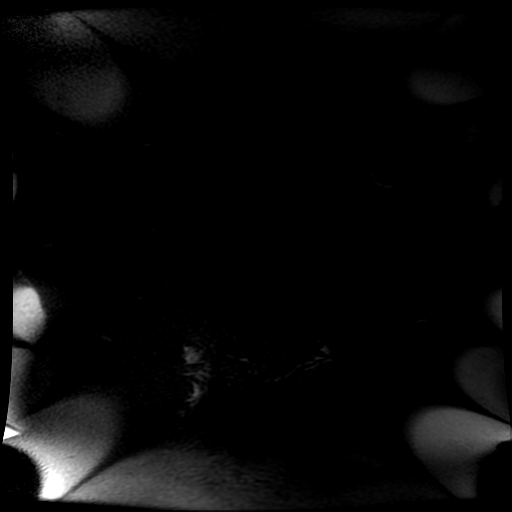
[im 35/35]
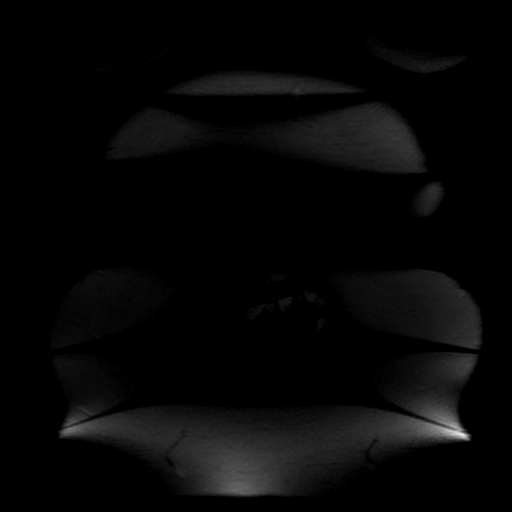

[Series 6: bSSFP · coronal · 5.0mm · 0.90mm/px · 3 of 41 slices shown (1 of 2)]
[im 1/41]
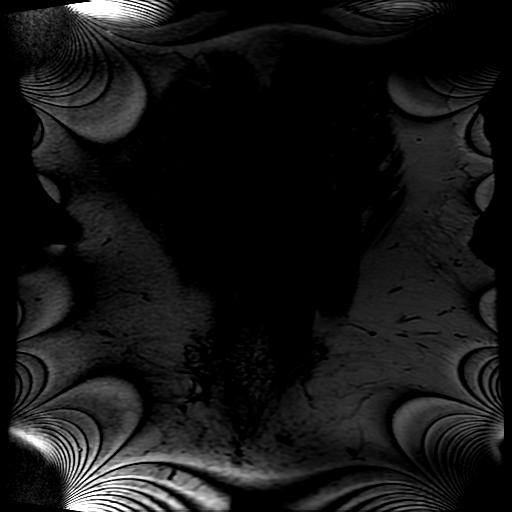
[im 21/41]
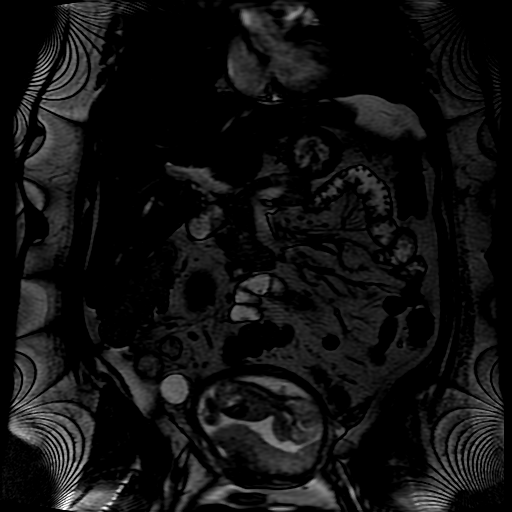
[im 41/41]
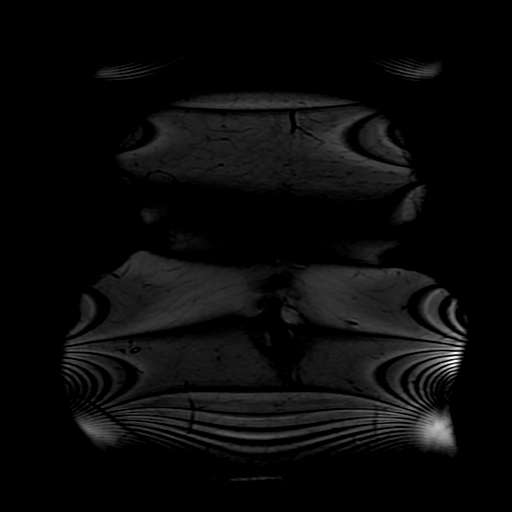

[Series 8: bSSFP · axial · 5.0mm · 0.78mm/px · z∈[+34,+244]mm · 3 of 36 slices shown (2 of 2)]
[im 1/36]
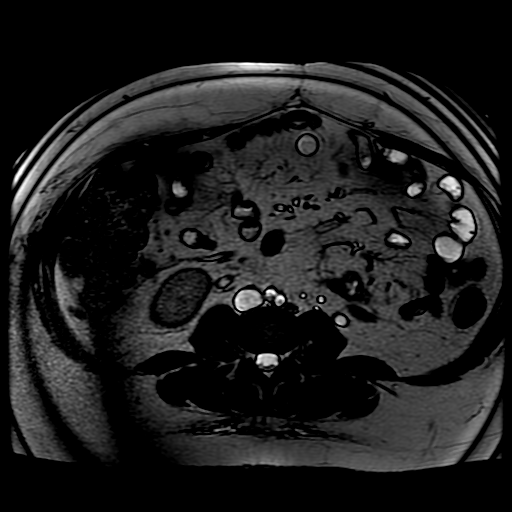
[im 18/36]
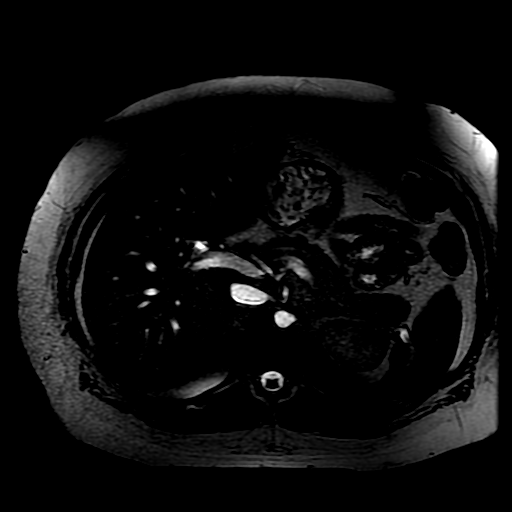
[im 36/36]
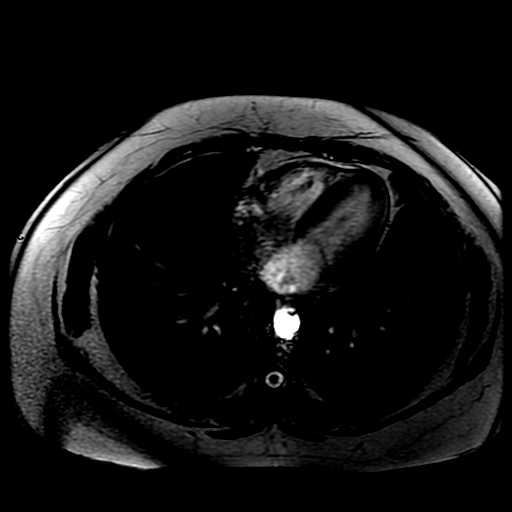

[Series 9: T2 fat-sat · axial · 5.0mm · 0.78mm/px · z∈[+34,+244]mm · 3 of 36 slices shown (2 of 2)]
[im 1/36]
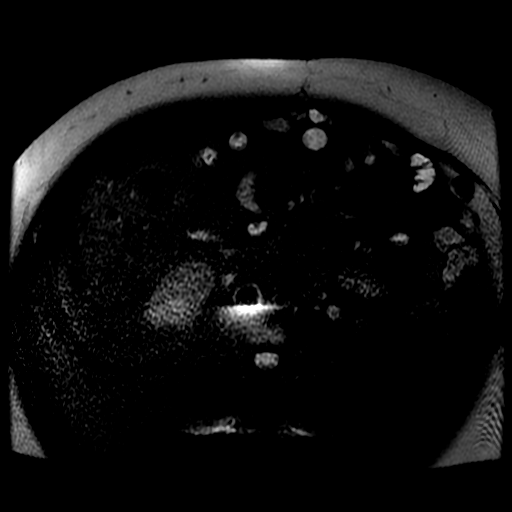
[im 18/36]
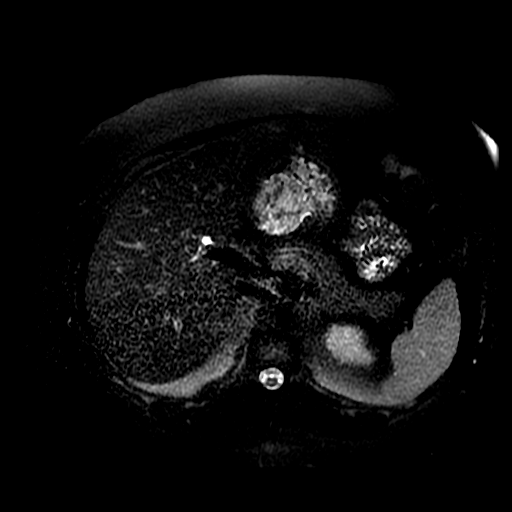
[im 36/36]
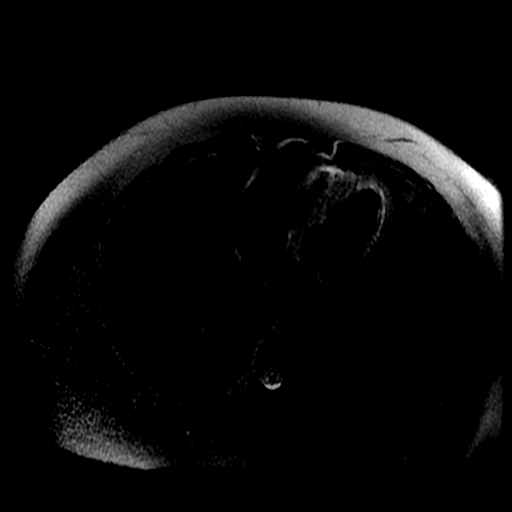

[Series 10: T1 dynamic · axial · 3.0mm · 0.78mm/px · z∈[+31,+181]mm · 6 of 144 slices shown]
[im 1/144]
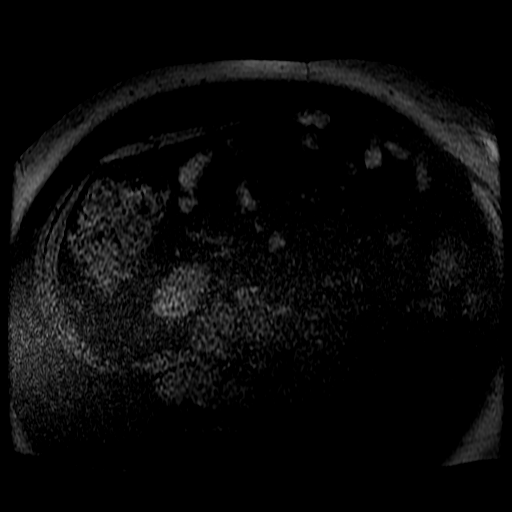
[im 29/144]
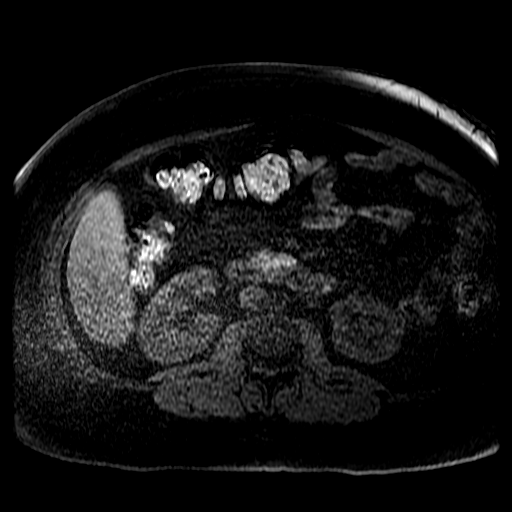
[im 43/144]
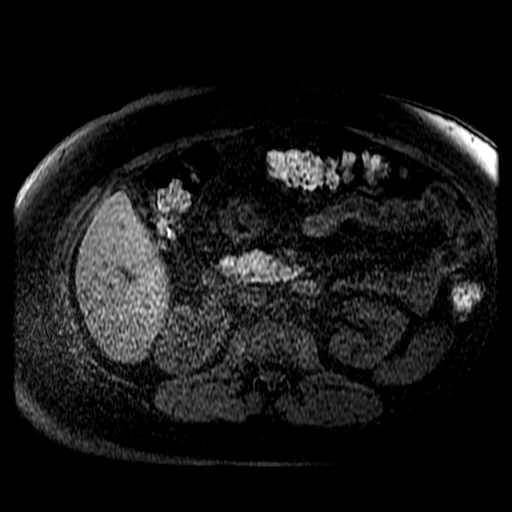
[im 58/144]
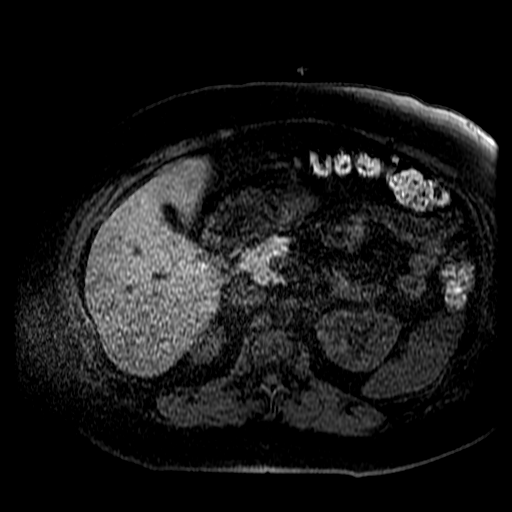
[im 86/144]
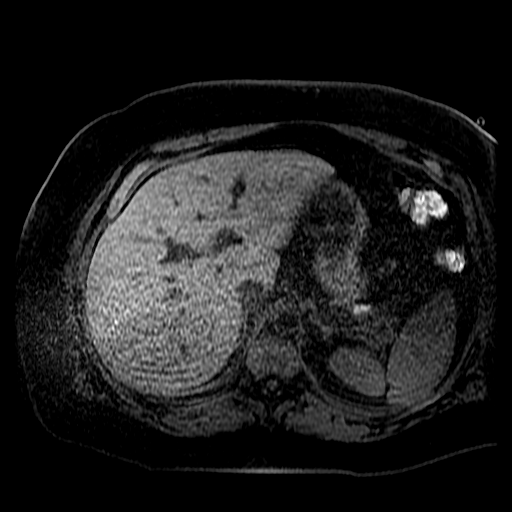
[im 101/144]
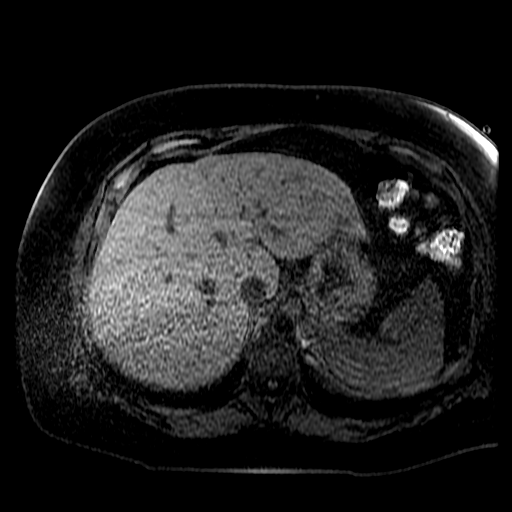

[19 of 48 positions shown; findings below may reference images not displayed]

FINDINGS: An intrauterine pregnancy is demonstrated. Fetus is visualized
within anterior placenta present. Simple appearing cyst in the right
ovary measuring 2.7 cm diameter, likely representing corpus luteal
cyst. Left ovary is unremarkable. No abnormal adnexal masses.
Bladder wall is not thickened. No free pelvic fluid collections. No
loculated fluid collections. No pelvic mass or lymphadenopathy. The
appendix is identified and appears normal. No periappendiceal
inflammation or fluid. No evidence of acute appendicitis. Scattered
diverticula in the sigmoid colon without evidence of diverticulitis.

The liver, spleen, pancreas, adrenal glands, abdominal aorta,
inferior vena cava, and retroperitoneal lymph nodes are
unremarkable. No bile duct dilatation. Gallbladder is not identified
and may be surgically absent or physiologically contracted. Stomach,
small bowel, and colon are not abnormally distended. No
hydronephrosis in either kidney. Tiny cyst in the left kidney. No
free fluid in the abdomen.
IMPRESSION: Normal appendix. No evidence of appendicitis. Intrauterine pregnancy
demonstrated. Corpus luteal cyst in the right ovary. No evidence of
bowel obstruction or acute inflammatory process.

## 2017-04-21 IMAGING — US US MFM OB FOLLOW-UP
1 series · 14 of 28 positions shown · non-contrast
Comparison: none

[Series 1: us mfm ob follow-up · 43 acquisitions, 14 frames shown]
[im 2/43]
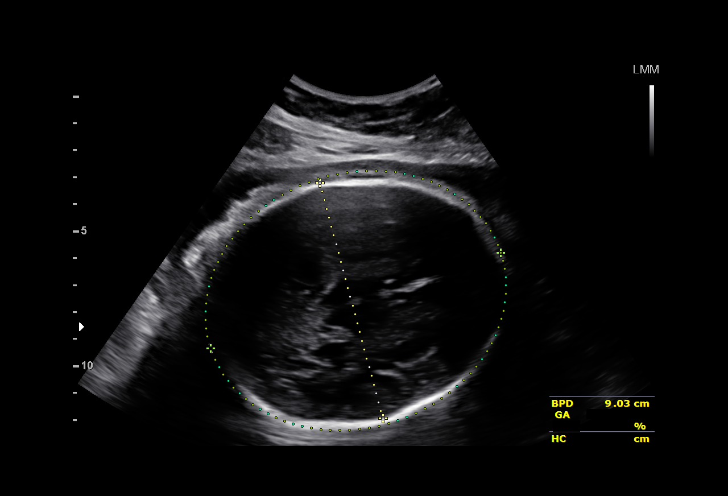
[im 5/43]
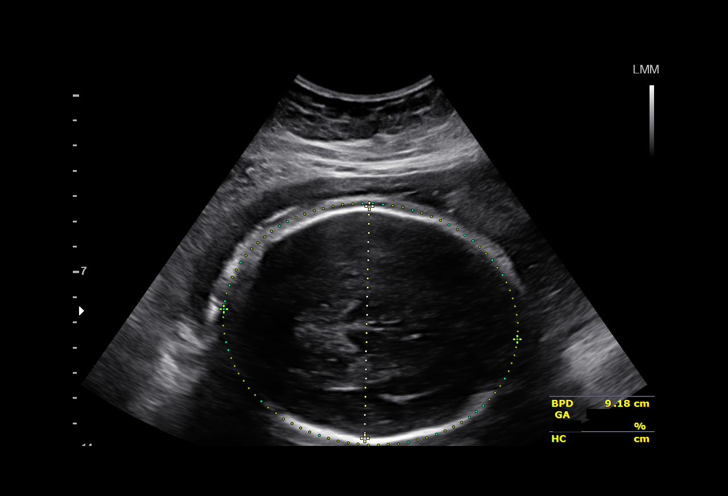
[im 8/43]
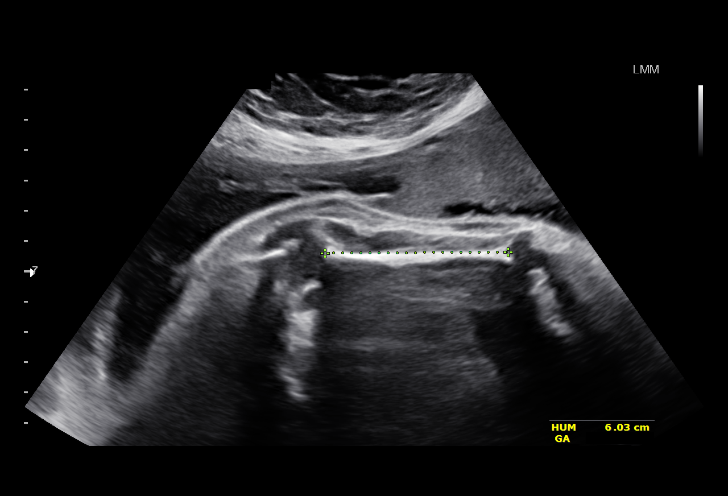
[im 11/43]
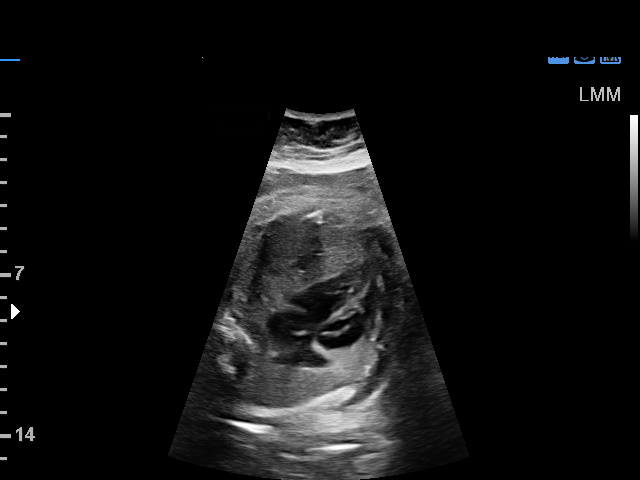
[im 15/43]
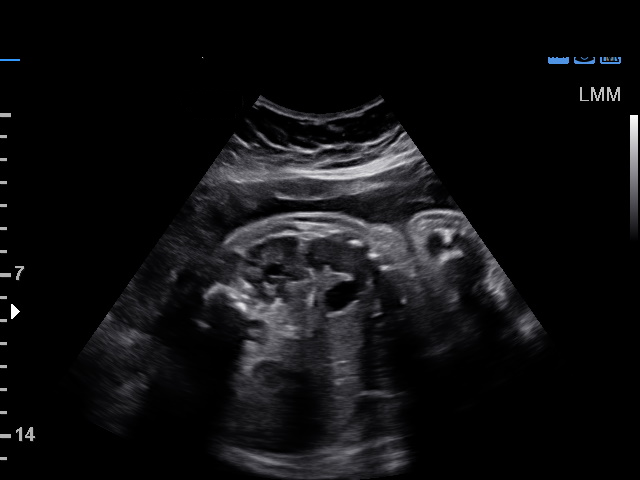
[im 18/43]
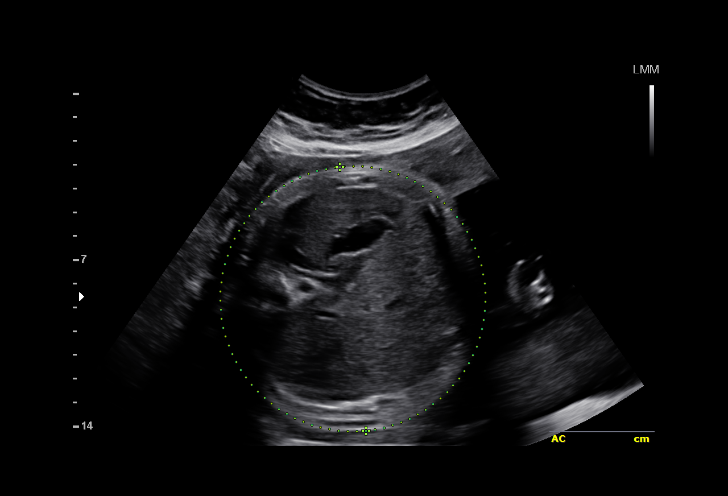
[im 21/43]
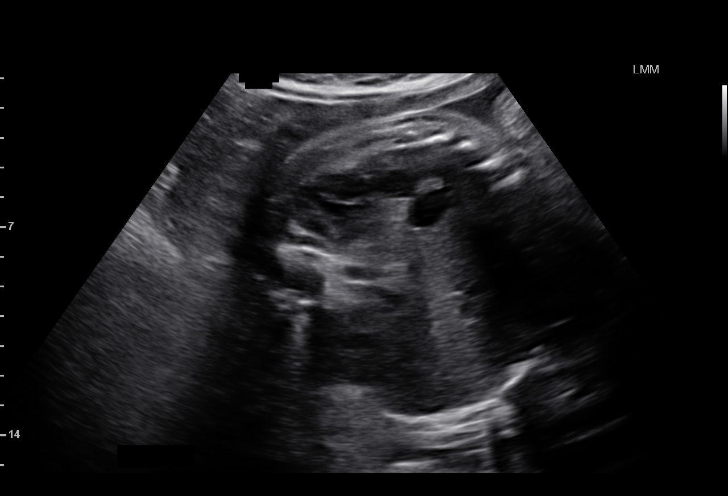
[im 24/43]
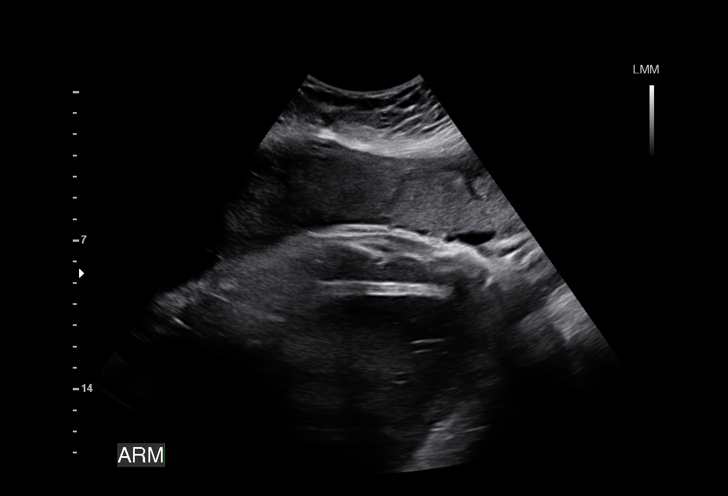
[im 27/43]
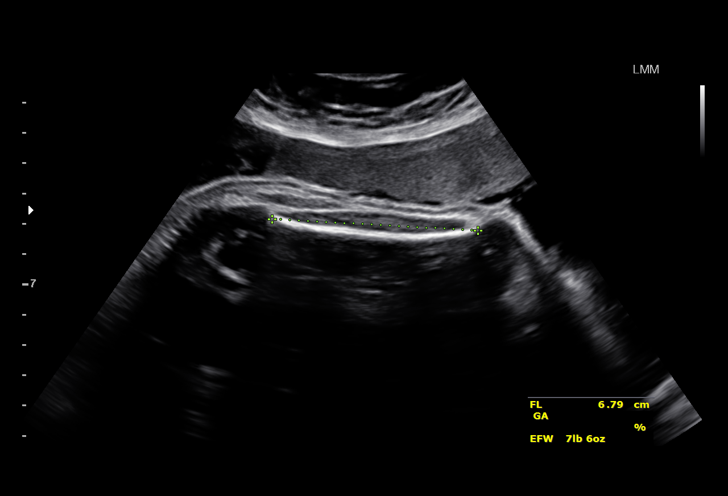
[im 30/43]
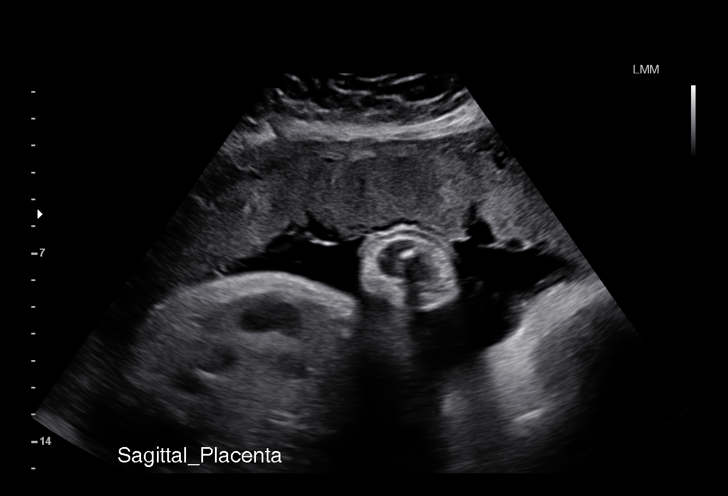
[im 33/43]
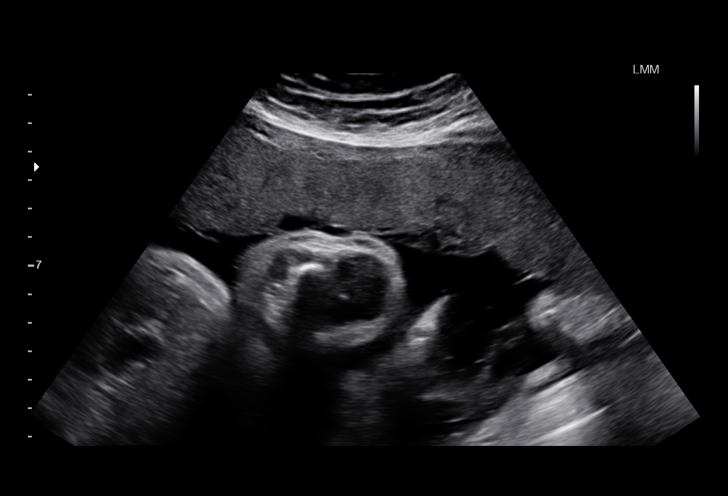
[im 36/43]
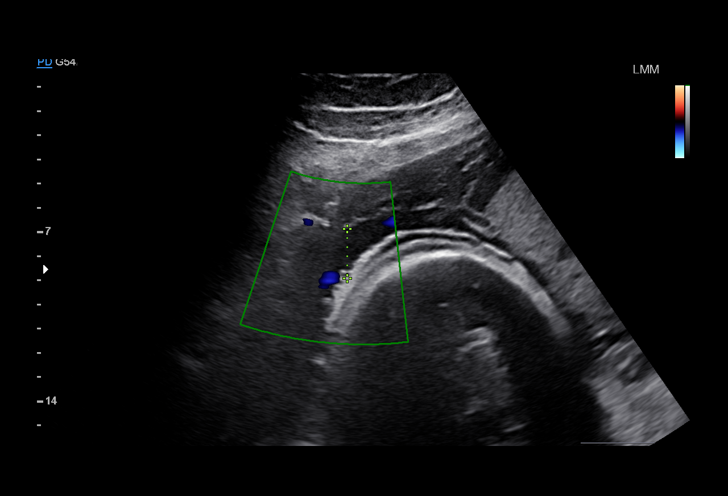
[im 39/43]
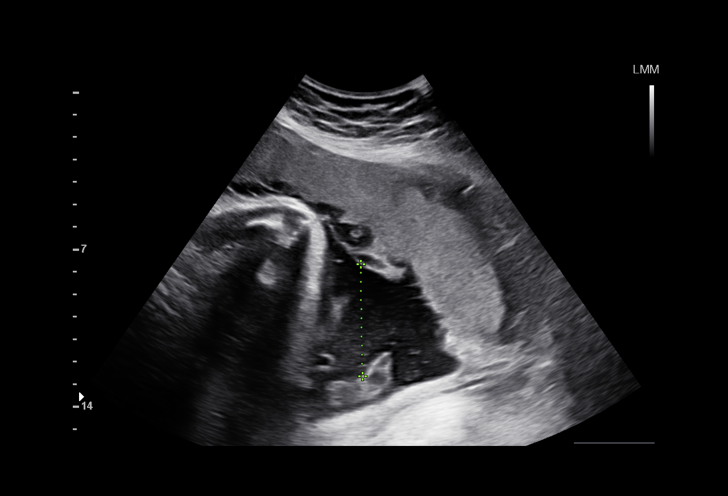
[im 43/43]
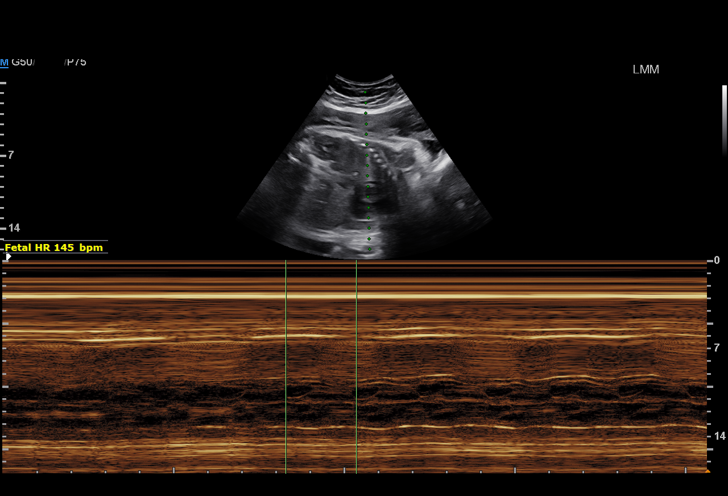

[14 of 28 positions shown; findings below may reference images not displayed]

pm)

Name:       AMELIE RASKIN                          Visit  11/18/2015 [DATE]
Date:

for [REDACTED]care

1  FAVIO EASTMAN            [id number]       [id number]     [id number]
Indications

35 weeks gestation of pregnancy
Previous cesarean delivery, antepartum
Poor obstetric history-Recurrent (habitual)
abortion (9 consecutive ab's)
Obesity complicating pregnancy, third
trimester
Advanced maternal age multigravida 35+,
third trimester; low risk NIPS
Diabetes - Pregestational, third trimester
on metformin
OB History

Height:        5'5"   Weight:   252        BMI:
Gravidity:     11        Term:  1        Prem:    0        SAB:   8
TOP:           1       Ectopic  0        Living:  1
:
Fetal Evaluation

Num Of Fetuses:      1
Fetal Heart          145
Rate(bpm):
Cardiac Activity:    Observed
Presentation:        Cephalic
Placenta:            Anterior, above cervical os
P. Cord Insertion:   Previously Visualized
Amniotic Fluid
AFI FV:      Subjectively within normal limits
AFI Sum:     16.34    cm      60  %Tile     Larg Pckt:    7.56   cm
RUQ:   2.04    cm    RLQ:   4.99    cm   LUQ:    1.75    cm   LLQ:    7.56   cm
Biometry

BPD:      91.2  mm     G. Age:   37w 0d                  CI:        74.65   %    70 - 86
FL/HC:      20.3   %    20.1 -
HC:        335  mm     G. Age:   38w 2d        86   %    HC/AC:      0.96        0.93 -
AC:      347.9  mm     G. Age:   38w 5d      > 97   %    FL/BPD      74.7   %    71 - 87
:
FL:       68.1  mm     G. Age:   35w 0d        36   %    FL/AC:      19.6   %    20 - 24
HUM:      59.8  mm     G. Age:   34w 5d        53   %

Est.        6339   gm    7 lb 3 oz    > 90   %
FW:
Gestational Age

LMP:           40w 5d        Date:  02/06/15                  EDD:   11/13/15
U/S Today:     37w 2d                                         EDD:   12/07/15
Best:          35w 2d    Det. By:   Early Ultrasound          EDD:   12/21/15
(05/23/15)
Anatomy

Cranium:          Appears normal         Aortic Arch:       Previously seen
Fetal Cavum:      Previously seen        Ductal Arch:       Previously seen
Ventricles:       Appears normal         Diaphragm:         Previously seen
Choroid Plexus:   Previously seen        Stomach:           Appears normal,
left sided
Cerebellum:       Previously seen        Abdomen:           Previously seen
Posterior         Previously seen        Abdominal          Previously seen
Fossa:                                   Wall:
Nuchal Fold:      Previously seen        Cord Vessels:      Previously seen
Face:             Orbits and profile     Kidneys:           Appear normal
previously seen
Lips:             Previously seen        Bladder:           Appears normal
Fetal Thoracic:   Appears normal         Spine:             Previously seen
Heart:            Previously seen        Upper              Previously seen
Extremities:
RVOT:             Previously seen        Lower              Previously seen
Extremities:
LVOT:             Previously seen

Other:   Technically difficult due to maternal habitus and fetal position.
Heels and Left 5th digit previously visualized.
Impression

SIUP at 35+2 weeks
Normal interval anatomy; anatomic survey complete
Normal amniotic fluid volume
Appropriate interval growth with EFW > 90th %tile; AC >
97th %tile; fetus at risk to be LGA
Recommendations

Continue antenatal testing until delivery
